# Patient Record
Sex: Female | Born: 1970 | Race: White | Hispanic: No | Smoking: Former smoker
Health system: Southern US, Community
[De-identification: ages and names within clinical notes are randomized; demographics above are authoritative.]

## PROBLEM LIST (undated history)

## (undated) DIAGNOSIS — E039 Hypothyroidism, unspecified: Secondary | ICD-10-CM

## (undated) DIAGNOSIS — R51 Headache: Secondary | ICD-10-CM

## (undated) DIAGNOSIS — R519 Headache, unspecified: Secondary | ICD-10-CM

## (undated) DIAGNOSIS — F419 Anxiety disorder, unspecified: Secondary | ICD-10-CM

## (undated) DIAGNOSIS — E079 Disorder of thyroid, unspecified: Secondary | ICD-10-CM

## (undated) DIAGNOSIS — K219 Gastro-esophageal reflux disease without esophagitis: Secondary | ICD-10-CM

## (undated) HISTORY — PX: DIAGNOSTIC LAPAROSCOPY: SUR761

## (undated) HISTORY — DX: Disorder of thyroid, unspecified: E07.9

---
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
## 1990-04-06 HISTORY — PX: WISDOM TOOTH EXTRACTION: SHX21

## 1998-09-28 ENCOUNTER — Emergency Department (HOSPITAL_COMMUNITY): Admission: EM | Admit: 1998-09-28 | Discharge: 1998-09-28 | Payer: Self-pay | Admitting: Emergency Medicine

## 2000-04-30 ENCOUNTER — Other Ambulatory Visit: Admission: RE | Admit: 2000-04-30 | Discharge: 2000-04-30 | Payer: Self-pay | Admitting: Obstetrics and Gynecology

## 2000-08-02 ENCOUNTER — Other Ambulatory Visit: Admission: RE | Admit: 2000-08-02 | Discharge: 2000-08-02 | Payer: Self-pay | Admitting: Obstetrics and Gynecology

## 2000-09-13 ENCOUNTER — Other Ambulatory Visit: Admission: RE | Admit: 2000-09-13 | Discharge: 2000-09-13 | Payer: Self-pay | Admitting: Obstetrics and Gynecology

## 2000-09-13 ENCOUNTER — Encounter (INDEPENDENT_AMBULATORY_CARE_PROVIDER_SITE_OTHER): Payer: Self-pay | Admitting: Specialist

## 2001-03-23 ENCOUNTER — Other Ambulatory Visit: Admission: RE | Admit: 2001-03-23 | Discharge: 2001-03-23 | Payer: Self-pay | Admitting: Obstetrics and Gynecology

## 2001-06-20 ENCOUNTER — Other Ambulatory Visit: Admission: RE | Admit: 2001-06-20 | Discharge: 2001-06-20 | Payer: Self-pay | Admitting: Obstetrics and Gynecology

## 2001-10-26 ENCOUNTER — Other Ambulatory Visit: Admission: RE | Admit: 2001-10-26 | Discharge: 2001-10-26 | Payer: Self-pay | Admitting: Family Medicine

## 2001-12-16 ENCOUNTER — Encounter: Admission: RE | Admit: 2001-12-16 | Discharge: 2001-12-16 | Payer: Self-pay

## 2002-01-16 ENCOUNTER — Observation Stay (HOSPITAL_COMMUNITY): Admission: AD | Admit: 2002-01-16 | Discharge: 2002-01-17 | Payer: Self-pay

## 2002-06-09 ENCOUNTER — Other Ambulatory Visit: Admission: RE | Admit: 2002-06-09 | Discharge: 2002-06-09 | Payer: Self-pay

## 2002-06-14 ENCOUNTER — Encounter: Admission: RE | Admit: 2002-06-14 | Discharge: 2002-06-14 | Payer: Self-pay

## 2003-01-11 ENCOUNTER — Other Ambulatory Visit: Admission: RE | Admit: 2003-01-11 | Discharge: 2003-01-11 | Payer: Self-pay | Admitting: Family Medicine

## 2003-03-12 ENCOUNTER — Emergency Department (HOSPITAL_COMMUNITY): Admission: AD | Admit: 2003-03-12 | Discharge: 2003-03-12 | Payer: Self-pay | Admitting: Family Medicine

## 2003-03-14 ENCOUNTER — Encounter: Admission: RE | Admit: 2003-03-14 | Discharge: 2003-03-14 | Payer: Self-pay | Admitting: General Surgery

## 2003-03-16 ENCOUNTER — Encounter: Admission: RE | Admit: 2003-03-16 | Discharge: 2003-03-16 | Payer: Self-pay | Admitting: General Surgery

## 2003-07-17 ENCOUNTER — Other Ambulatory Visit: Admission: RE | Admit: 2003-07-17 | Discharge: 2003-07-17 | Payer: Self-pay | Admitting: *Deleted

## 2003-07-17 ENCOUNTER — Other Ambulatory Visit: Admission: RE | Admit: 2003-07-17 | Discharge: 2003-07-17 | Payer: Self-pay | Admitting: Obstetrics and Gynecology

## 2003-12-26 ENCOUNTER — Inpatient Hospital Stay (HOSPITAL_COMMUNITY): Admission: AD | Admit: 2003-12-26 | Discharge: 2003-12-26 | Payer: Self-pay | Admitting: *Deleted

## 2004-02-07 ENCOUNTER — Inpatient Hospital Stay (HOSPITAL_COMMUNITY): Admission: AD | Admit: 2004-02-07 | Discharge: 2004-02-10 | Payer: Self-pay | Admitting: Obstetrics and Gynecology

## 2004-02-11 ENCOUNTER — Encounter: Admission: RE | Admit: 2004-02-11 | Discharge: 2004-03-12 | Payer: Self-pay | Admitting: *Deleted

## 2005-03-03 ENCOUNTER — Emergency Department (HOSPITAL_COMMUNITY): Admission: EM | Admit: 2005-03-03 | Discharge: 2005-03-03 | Payer: Self-pay | Admitting: Family Medicine

## 2006-08-19 ENCOUNTER — Encounter: Admission: RE | Admit: 2006-08-19 | Discharge: 2006-08-19 | Payer: Self-pay | Admitting: Gastroenterology

## 2007-05-23 ENCOUNTER — Encounter: Admission: RE | Admit: 2007-05-23 | Discharge: 2007-05-23 | Payer: Self-pay | Admitting: *Deleted

## 2010-04-27 ENCOUNTER — Encounter: Payer: Self-pay | Admitting: Gastroenterology

## 2010-04-27 ENCOUNTER — Encounter: Payer: Self-pay | Admitting: *Deleted

## 2010-08-22 NOTE — H&P (Signed)
[name], [name]                ACCOUNT NO.:  [id number]   MEDICAL RECORD NO.:  [id number]           PATIENT TYPE:   LOCATION:                                 FACILITY:   PHYSICIAN:  [place] B. Earlene Plater, M.D.       DATE OF BIRTH:   DATE OF ADMISSION:  DATE OF DISCHARGE:                                HISTORY & PHYSICAL   DATE OF ADMISSION:  February 07, 2004   ADMISSION DIAGNOSES:  1.  Term intrauterine pregnancy.  2.  Spontaneous labor.   HISTORY OF PRESENT ILLNESS:  The patient is admitted at 39+ weeks with  regular painful contractions.  Was seen in the office 2 days ago and was 1-2  cm dilated at that time.  She is now found to be 4 cm dilated and having  regular contractions in the office.  No leakage of fluid or vaginal  bleeding.   Prenatal care at [facility], Dr. Earlene Plater, uncomplicated other than  history of previous LEEP with no history of preterm cervical change and new  onset genital condyloma identified during pregnancy.   PAST MEDICAL HISTORY, PAST SURGICAL HISTORY, FAMILY HISTORY:  See prenatal  record.   PRENATAL LABORATORY DATA:  Group B strep is negative.  For others, see  prenatal record.   PHYSICAL EXAMINATION:  VITAL SIGNS:  The patient is afebrile with stable  vital signs.  Fetal heart tones in the 130s.  GENERAL:  Alert and oriented, no acute distress.  SKIN:  Warm and dry, no lesions.  HEART:  Regular rate and rhythm.  LUNGS:  Clear to auscultation.  ABDOMEN:  Gravid, fundal height is appropriate, estimated fetal weight 8  pounds.  VAGINAL:  Cervix is 4 cm dilated, 90% effaced, 0 station, and vertex.   ASSESSMENT:  Term intrauterine pregnancy, spontaneous labor.   PLAN:  Admission for management of labor.     Wesl   WBD/MEDQ  D:  02/07/2004  T:  02/07/2004  Job:  [id number]

## 2010-08-27 ENCOUNTER — Other Ambulatory Visit: Payer: Self-pay | Admitting: *Deleted

## 2010-08-27 MED ORDER — NITROGLYCERIN 0.4 MG SL SUBL: 0.4000 mg | SUBLINGUAL_TABLET | SUBLINGUAL | Status: DC | PRN

## 2010-11-28 ENCOUNTER — Other Ambulatory Visit: Payer: Self-pay | Admitting: Obstetrics and Gynecology

## 2010-11-28 DIAGNOSIS — Z1231 Encounter for screening mammogram for malignant neoplasm of breast: Secondary | ICD-10-CM

## 2011-01-05 ENCOUNTER — Ambulatory Visit
Admission: RE | Admit: 2011-01-05 | Discharge: 2011-01-05 | Disposition: A | Discharge status: Home / Self Care | Payer: Private Health Insurance - Indemnity | Source: Ambulatory Visit | Attending: Obstetrics and Gynecology | Admitting: Obstetrics and Gynecology

## 2011-01-05 DIAGNOSIS — Z1231 Encounter for screening mammogram for malignant neoplasm of breast: Secondary | ICD-10-CM

## 2011-04-23 ENCOUNTER — Ambulatory Visit
Admission: RE | Admit: 2011-04-23 | Discharge: 2011-04-23 | Disposition: A | Discharge status: Home / Self Care | Payer: 59 | Source: Ambulatory Visit | Attending: Gastroenterology | Admitting: Gastroenterology

## 2011-04-23 ENCOUNTER — Other Ambulatory Visit: Payer: Self-pay | Admitting: Gastroenterology

## 2011-04-23 DIAGNOSIS — R509 Fever, unspecified: Secondary | ICD-10-CM

## 2011-04-23 MED ORDER — IOHEXOL 300 MG/ML  SOLN
100.0000 mL | Freq: Once | INTRAMUSCULAR | Status: AC | PRN
  Administered 2011-04-23: 100 mL via INTRAVENOUS

## 2011-12-08 ENCOUNTER — Other Ambulatory Visit: Payer: Self-pay | Admitting: Obstetrics and Gynecology

## 2011-12-08 DIAGNOSIS — Z1231 Encounter for screening mammogram for malignant neoplasm of breast: Secondary | ICD-10-CM

## 2012-01-06 ENCOUNTER — Ambulatory Visit
Admission: RE | Admit: 2012-01-06 | Discharge: 2012-01-06 | Disposition: A | Discharge status: Home / Self Care | Payer: Managed Care, Other (non HMO) | Source: Ambulatory Visit | Attending: Obstetrics and Gynecology | Admitting: Obstetrics and Gynecology

## 2012-01-06 DIAGNOSIS — Z1231 Encounter for screening mammogram for malignant neoplasm of breast: Secondary | ICD-10-CM

## 2012-01-07 ENCOUNTER — Other Ambulatory Visit: Payer: Self-pay | Admitting: Obstetrics and Gynecology

## 2012-01-07 DIAGNOSIS — N63 Unspecified lump in unspecified breast: Secondary | ICD-10-CM

## 2012-01-13 ENCOUNTER — Ambulatory Visit
Admission: RE | Admit: 2012-01-13 | Discharge: 2012-01-13 | Disposition: A | Discharge status: Home / Self Care | Payer: Private Health Insurance - Indemnity | Source: Ambulatory Visit | Attending: Obstetrics and Gynecology | Admitting: Obstetrics and Gynecology

## 2012-01-13 DIAGNOSIS — N63 Unspecified lump in unspecified breast: Secondary | ICD-10-CM

## 2012-09-15 ENCOUNTER — Other Ambulatory Visit: Payer: Self-pay | Admitting: Family Medicine

## 2012-09-15 DIAGNOSIS — E039 Hypothyroidism, unspecified: Secondary | ICD-10-CM

## 2012-09-23 ENCOUNTER — Ambulatory Visit
Admission: RE | Admit: 2012-09-23 | Discharge: 2012-09-23 | Disposition: A | Discharge status: Home / Self Care | Payer: Private Health Insurance - Indemnity | Source: Ambulatory Visit | Attending: Family Medicine | Admitting: Family Medicine

## 2012-09-23 DIAGNOSIS — E039 Hypothyroidism, unspecified: Secondary | ICD-10-CM

## 2013-01-02 ENCOUNTER — Other Ambulatory Visit: Payer: Self-pay | Admitting: Endocrinology

## 2013-01-02 DIAGNOSIS — E039 Hypothyroidism, unspecified: Secondary | ICD-10-CM

## 2013-01-23 ENCOUNTER — Other Ambulatory Visit: Payer: Self-pay

## 2013-01-23 DIAGNOSIS — Z1231 Encounter for screening mammogram for malignant neoplasm of breast: Secondary | ICD-10-CM

## 2013-02-16 ENCOUNTER — Ambulatory Visit
Admission: RE | Admit: 2013-02-16 | Discharge: 2013-02-16 | Disposition: A | Discharge status: Home / Self Care | Payer: BC Managed Care – PPO | Source: Ambulatory Visit

## 2013-02-16 DIAGNOSIS — Z1231 Encounter for screening mammogram for malignant neoplasm of breast: Secondary | ICD-10-CM

## 2013-12-18 ENCOUNTER — Other Ambulatory Visit: Payer: Private Health Insurance - Indemnity

## 2014-07-28 ENCOUNTER — Ambulatory Visit (INDEPENDENT_AMBULATORY_CARE_PROVIDER_SITE_OTHER): Payer: BLUE CROSS/BLUE SHIELD

## 2014-07-28 ENCOUNTER — Ambulatory Visit (INDEPENDENT_AMBULATORY_CARE_PROVIDER_SITE_OTHER): Payer: BLUE CROSS/BLUE SHIELD | Admitting: Family Medicine

## 2014-07-28 VITALS — BP 106/78 | HR 95 | Temp 98.2°F | Resp 18 | Ht 64.5 in | Wt 172.0 lb

## 2014-07-28 DIAGNOSIS — R0789 Other chest pain: Secondary | ICD-10-CM

## 2014-07-28 DIAGNOSIS — M542 Cervicalgia: Secondary | ICD-10-CM

## 2014-07-28 DIAGNOSIS — Z8639 Personal history of other endocrine, nutritional and metabolic disease: Secondary | ICD-10-CM | POA: Insufficient documentation

## 2014-07-28 DIAGNOSIS — F988 Other specified behavioral and emotional disorders with onset usually occurring in childhood and adolescence: Secondary | ICD-10-CM | POA: Insufficient documentation

## 2014-07-28 DIAGNOSIS — F909 Attention-deficit hyperactivity disorder, unspecified type: Secondary | ICD-10-CM | POA: Diagnosis not present

## 2014-07-28 NOTE — Progress Notes (Signed)
Chief Complaint  Patient presents with  . Neck Pain    front left side, some jaw pain, x 2 weeks  . Shortness of Breath   No Known Allergies    Prior to Admission medications   Medication Sig Start Date End Date Taking? Authorizing Provider  amphetamine-dextroamphetamine (ADDERALL) 30 MG tablet Take 30 mg by mouth daily.   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 She's had 2-3 weeks of progressive left neck pain which feels a tightness and radiates into her chest. It's nonexertional. She's had no sore throat.  Patient's past medical history is significant for thyroid nodule and history of hypothyroidism. Nevertheless this is been checked repeatedly and she does not have hypothyroidism now. She's also had an ultrasound and that has not shown anything but some thyroid nodules.  This patient has had no nausea, shortness of breath, vomiting, shoulder pain or abdominal pain.  Ex-smoker  Objective:   BP 106/78 mmHg  Pulse 95  Temp(Src) 98.2 F (36.8 C)  Resp 18  Ht 5' 4.5" (1.638 m)  Wt 172 lb (78.019 kg)  BMI 29.08 kg/m2  SpO2 77%   Sit 44 year old woman appearing stated age. She is in no acute distress. She is very articulate, alert and cooperative HEENT: Unremarkable. Thyroid reveals some fine nodularity and slight increase in size. There Is no adenopathy. TMs are normal. Oropharynx is clear.  Neck: Supple with full range of motion Chest: Clear Heart: Regular no murmur Extremities:  No edema  EKG:  NSR  [facility] reading (PRIMARY) by  Dr. Joseph Art:  Please comment onj opacity on lateral view of chest which I believe is normal (overlapping shadows) and please comment on C7 on AP view of neck which I also think is normal but is a little blurry.  Assessment: I think the patient has GERD. She's had this in the past and her symptoms would certainly fit. At the same time I don't want to miss a cardiac  etiology.  Plan: Start baby aspirin daily and omeprazole 20 mg over-the-counter daily. I will order cardiology consult but cancel this if the chest pain goes away over the next 48 hours.  I explained the patient that GERD can cause everything that she is experiencing. I will have the radiologist over read her films but I don't want patient to worry too much because this is such an atypical kind of pain that is most consistent with GERD. Neck pain on left side - Plan: EKG 12-Lead, EKG 12-Lead, DG Cervical Spine 2 or 3 views  ADD (attention deficit disorder)  Neck pain - Plan: DG Chest 2 View, DG Chest 2 View  Other chest pain    Signed, Carola Frost.D.

## 2014-07-31 ENCOUNTER — Telehealth: Payer: Self-pay | Admitting: Family Medicine

## 2014-07-31 NOTE — Telephone Encounter (Signed)
Patient is calling to get results of her chest x-ray  [phone]

## 2014-07-31 NOTE — Telephone Encounter (Signed)
Pt inquiring about xray results. They haven't been reviewed yet.

## 2014-07-31 NOTE — Telephone Encounter (Signed)
Reported normal x-ray results. Patient verbalized understanding.

## 2015-01-02 ENCOUNTER — Other Ambulatory Visit: Payer: Self-pay | Admitting: Obstetrics and Gynecology

## 2015-01-02 DIAGNOSIS — N6489 Other specified disorders of breast: Secondary | ICD-10-CM

## 2015-01-08 ENCOUNTER — Ambulatory Visit
Admission: RE | Admit: 2015-01-08 | Discharge: 2015-01-08 | Disposition: A | Discharge status: Home / Self Care | Payer: PRIVATE HEALTH INSURANCE | Source: Ambulatory Visit | Attending: Obstetrics and Gynecology | Admitting: Obstetrics and Gynecology

## 2015-01-08 DIAGNOSIS — N6489 Other specified disorders of breast: Secondary | ICD-10-CM

## 2015-01-09 ENCOUNTER — Other Ambulatory Visit: Payer: Self-pay

## 2015-10-11 ENCOUNTER — Emergency Department (HOSPITAL_BASED_OUTPATIENT_CLINIC_OR_DEPARTMENT_OTHER): Payer: BLUE CROSS/BLUE SHIELD

## 2015-10-11 ENCOUNTER — Emergency Department (HOSPITAL_BASED_OUTPATIENT_CLINIC_OR_DEPARTMENT_OTHER)
Admission: EM | Admit: 2015-10-11 | Discharge: 2015-10-11 | Disposition: A | Discharge status: Home / Self Care | Payer: BLUE CROSS/BLUE SHIELD | Attending: Emergency Medicine | Admitting: Emergency Medicine

## 2015-10-11 ENCOUNTER — Encounter (HOSPITAL_BASED_OUTPATIENT_CLINIC_OR_DEPARTMENT_OTHER): Payer: Self-pay | Admitting: Emergency Medicine

## 2015-10-11 DIAGNOSIS — Y999 Unspecified external cause status: Secondary | ICD-10-CM | POA: Diagnosis not present

## 2015-10-11 DIAGNOSIS — Y929 Unspecified place or not applicable: Secondary | ICD-10-CM | POA: Insufficient documentation

## 2015-10-11 DIAGNOSIS — S93602A Unspecified sprain of left foot, initial encounter: Secondary | ICD-10-CM | POA: Insufficient documentation

## 2015-10-11 DIAGNOSIS — F17200 Nicotine dependence, unspecified, uncomplicated: Secondary | ICD-10-CM | POA: Insufficient documentation

## 2015-10-11 DIAGNOSIS — L089 Local infection of the skin and subcutaneous tissue, unspecified: Secondary | ICD-10-CM

## 2015-10-11 DIAGNOSIS — Y939 Activity, unspecified: Secondary | ICD-10-CM | POA: Diagnosis not present

## 2015-10-11 DIAGNOSIS — S80212A Abrasion, left knee, initial encounter: Secondary | ICD-10-CM | POA: Insufficient documentation

## 2015-10-11 DIAGNOSIS — S99922A Unspecified injury of left foot, initial encounter: Secondary | ICD-10-CM | POA: Diagnosis present

## 2015-10-11 MED ORDER — FLUCONAZOLE 150 MG PO TABS: ORAL_TABLET | ORAL | Status: DC

## 2015-10-11 MED ORDER — CEPHALEXIN 250 MG PO CAPS: 250.0000 mg | ORAL_CAPSULE | Freq: Four times a day (QID) | ORAL | Status: DC

## 2015-10-11 MED ORDER — CEPHALEXIN 250 MG PO CAPS
1000.0000 mg | ORAL_CAPSULE | Freq: Once | ORAL | Status: AC
  Administered 2015-10-11: 1000 mg via ORAL
  Filled 2015-10-11: qty 4

## 2015-10-11 NOTE — ED Notes (Signed)
Pt c/o left leg pain after falling off of a golf cart x 5 days ago. Pt has abrasion to left knee, swelling and bruising to left ankle. Pt ambulatory without difficulty.

## 2015-10-11 NOTE — ED Provider Notes (Signed)
CSN: [id number]     Arrival date & time 10/11/15  0100 History   First MD Initiated Contact with Patient 10/11/15 [phone]     Chief Complaint  Patient presents with  . Leg Injury     (Consider location/radiation/quality/duration/timing/severity/associated sxs/prior Treatment) HPI  This is a 45 year old female who fell off the back of a golf cart 5 days ago. She has an abrasion to her left knee and abrasions and ecchymoses to her left ankle and foot. She has developed erythema, pain and tenderness of the anterior left lower leg over the past several days. Pain is moderate and worse with palpation or movement. She is able to bear weight and ambulate. There is no associated deformity.   Past Medical History  Diagnosis Date  . Thyroid disease    History reviewed. No pertinent past surgical history. Family History  Problem Relation Age of Onset  . Hypertension Mother   . Hyperlipidemia Mother   . Hypertension Father    Social History  Substance Use Topics  . Smoking status: Current Every Day Smoker  . Smokeless tobacco: None  . Alcohol Use: 0.0 oz/week    0 Standard drinks or equivalent per week   OB History    No data available     Review of Systems  All other systems reviewed and are negative.   Allergies  Review of patient's allergies indicates no known allergies.  Home Medications   Prior to Admission medications   Medication Sig Start Date End Date Taking? Authorizing Provider  amphetamine-dextroamphetamine (ADDERALL) 30 MG tablet Take 30 mg by mouth daily.    Historical Provider, MD   BP 125/90 mmHg  Pulse 95  Temp(Src) 97.7 F (36.5 C) (Oral)  Resp 18  Ht 5\' 4"  (1.626 m)  Wt 152 lb (68.947 kg)  BMI 26.08 kg/m2  SpO2 100%   Physical Exam  General: Well-developed, well-nourished female in no acute distress; appearance consistent with age of record HENT: normocephalic; atraumatic Eyes: pupils equal, round and reactive to light; extraocular muscles intact Neck:  supple Heart: regular rate and rhythm Lungs: clear to auscultation bilaterally Abdomen: soft; nondistended; nontender; bowel sounds present Extremities: No deformity; full range of motion; pulses normal; ecchymosis of left ankle and foot with tenderness of the dorsal left foot; abrasion of left knee with erythema and tenderness distally:   Neurologic: Awake, alert and oriented; motor function intact in all extremities and symmetric; no facial droop Skin: Warm and dry Psychiatric: Normal mood and affect    ED Course  Procedures (including critical care time)   MDM  Nursing notes and vitals signs, including pulse oximetry, reviewed.  Summary of this visit's results, reviewed by myself:  Imaging Studies: Dg Foot Complete Left  10/11/2015  CLINICAL DATA:  Right foot pain after fall off golf cart 5 days prior. EXAM: LEFT FOOT - COMPLETE 3+ VIEW COMPARISON:  None. FINDINGS: There is no evidence of fracture or dislocation. There is no evidence of arthropathy or other focal bone abnormality. Tiny plantar calcaneal spur. Soft tissues are unremarkable. IMPRESSION: Negative for acute bony abnormality. Electronically Signed   By: Jeb Levering M.D.   On: 10/11/2015 03:02        Shanon Rosser, MD 10/11/15 [phone]

## 2016-02-29 ENCOUNTER — Inpatient Hospital Stay (HOSPITAL_COMMUNITY)
Admission: EM | Admit: 2016-02-29 | Discharge: 2016-03-02 | DRG: 872 | Disposition: A | Discharge status: Home / Self Care | Payer: BLUE CROSS/BLUE SHIELD | Attending: Internal Medicine | Admitting: Internal Medicine

## 2016-02-29 ENCOUNTER — Emergency Department (HOSPITAL_COMMUNITY): Payer: BLUE CROSS/BLUE SHIELD

## 2016-02-29 ENCOUNTER — Encounter (HOSPITAL_COMMUNITY): Payer: Self-pay

## 2016-02-29 DIAGNOSIS — R1013 Epigastric pain: Secondary | ICD-10-CM

## 2016-02-29 DIAGNOSIS — E872 Acidosis: Secondary | ICD-10-CM | POA: Diagnosis present

## 2016-02-29 DIAGNOSIS — K529 Noninfective gastroenteritis and colitis, unspecified: Secondary | ICD-10-CM | POA: Diagnosis present

## 2016-02-29 DIAGNOSIS — E860 Dehydration: Secondary | ICD-10-CM

## 2016-02-29 DIAGNOSIS — E039 Hypothyroidism, unspecified: Secondary | ICD-10-CM | POA: Diagnosis present

## 2016-02-29 DIAGNOSIS — E042 Nontoxic multinodular goiter: Secondary | ICD-10-CM | POA: Diagnosis present

## 2016-02-29 DIAGNOSIS — K219 Gastro-esophageal reflux disease without esophagitis: Secondary | ICD-10-CM | POA: Diagnosis present

## 2016-02-29 DIAGNOSIS — Z79899 Other long term (current) drug therapy: Secondary | ICD-10-CM

## 2016-02-29 DIAGNOSIS — F17200 Nicotine dependence, unspecified, uncomplicated: Secondary | ICD-10-CM | POA: Diagnosis present

## 2016-02-29 DIAGNOSIS — D649 Anemia, unspecified: Secondary | ICD-10-CM | POA: Diagnosis present

## 2016-02-29 DIAGNOSIS — R6520 Severe sepsis without septic shock: Secondary | ICD-10-CM

## 2016-02-29 DIAGNOSIS — A419 Sepsis, unspecified organism: Principal | ICD-10-CM | POA: Diagnosis present

## 2016-02-29 DIAGNOSIS — Z8249 Family history of ischemic heart disease and other diseases of the circulatory system: Secondary | ICD-10-CM

## 2016-02-29 DIAGNOSIS — E876 Hypokalemia: Secondary | ICD-10-CM | POA: Diagnosis present

## 2016-02-29 LAB — URINALYSIS, ROUTINE W REFLEX MICROSCOPIC
Bilirubin Urine: NEGATIVE
Glucose, UA: NEGATIVE mg/dL
Hgb urine dipstick: NEGATIVE
Ketones, ur: NEGATIVE mg/dL
Leukocytes, UA: NEGATIVE
Nitrite: NEGATIVE
Protein, ur: NEGATIVE mg/dL
Specific Gravity, Urine: 1.022 (ref 1.005–1.030)
pH: 5.5 (ref 5.0–8.0)

## 2016-02-29 LAB — COMPREHENSIVE METABOLIC PANEL
ALT: 18 U/L (ref 14–54)
AST: 26 U/L (ref 15–41)
Albumin: 3.9 g/dL (ref 3.5–5.0)
Alkaline Phosphatase: 51 U/L (ref 38–126)
Anion gap: 10 (ref 5–15)
BUN: 9 mg/dL (ref 6–20)
CO2: 19 mmol/L — ABNORMAL LOW (ref 22–32)
Calcium: 8.9 mg/dL (ref 8.9–10.3)
Chloride: 110 mmol/L (ref 101–111)
Creatinine, Ser: 0.79 mg/dL (ref 0.44–1.00)
GFR calc Af Amer: >60 mL/min (ref >60)
GFR calc non Af Amer: >60 mL/min (ref >60)
Glucose, Bld: 108 mg/dL — ABNORMAL HIGH (ref 65–99)
Potassium: 3.7 mmol/L (ref 3.5–5.1)
Sodium: 139 mmol/L (ref 135–145)
Total Bilirubin: 0.5 mg/dL (ref 0.3–1.2)
Total Protein: 6.5 g/dL (ref 6.5–8.1)

## 2016-02-29 LAB — CBC
HCT: 43.1 % (ref 36.0–46.0)
HEMOGLOBIN: 15.2 g/dL — AB (ref 12.0–15.0)
MCH: 32.6 pg (ref 26.0–34.0)
MCHC: 35.3 g/dL (ref 30.0–36.0)
MCV: 92.5 fL (ref 78.0–100.0)
Platelets: 308 10*3/uL (ref 150–400)
RBC: 4.66 MIL/uL (ref 3.87–5.11)
RDW: 12.6 % (ref 11.5–15.5)
WBC: 18.6 10*3/uL — AB (ref 4.0–10.5)

## 2016-02-29 LAB — I-STAT BETA HCG BLOOD, ED (MC, WL, AP ONLY): I-stat hCG, quantitative: <5 m[IU]/mL (ref <5)

## 2016-02-29 LAB — I-STAT CG4 LACTIC ACID, ED: Lactic Acid, Venous: 2.82 mmol/L (ref 0.5–1.9)

## 2016-02-29 LAB — LIPASE, BLOOD: Lipase: 42 U/L (ref 11–51)

## 2016-02-29 MED ORDER — SODIUM CHLORIDE 0.9 % IV BOLUS (SEPSIS)
1000.0000 mL | Freq: Once | INTRAVENOUS | Status: AC
Start: 2016-02-29 — End: 2016-03-01
  Administered 2016-02-29: 1000 mL via INTRAVENOUS

## 2016-02-29 MED ORDER — SODIUM CHLORIDE 0.9 % IV SOLN
3.0000 g | Freq: Once | INTRAVENOUS | Status: AC
  Administered 2016-02-29: 3 g via INTRAVENOUS
  Filled 2016-02-29: qty 3

## 2016-02-29 MED ORDER — ONDANSETRON 4 MG PO TBDP
4.0000 mg | ORAL_TABLET | Freq: Once | ORAL | Status: AC
  Administered 2016-02-29: 4 mg via ORAL

## 2016-02-29 MED ORDER — IOPAMIDOL (ISOVUE-300) INJECTION 61%
INTRAVENOUS | Status: AC
  Administered 2016-02-29: 100 mL
  Filled 2016-02-29: qty 100

## 2016-02-29 MED ORDER — FAMOTIDINE IN NACL 20-0.9 MG/50ML-% IV SOLN
20.0000 mg | Freq: Once | INTRAVENOUS | Status: AC
  Administered 2016-02-29: 20 mg via INTRAVENOUS
  Filled 2016-02-29: qty 50

## 2016-02-29 MED ORDER — ACETAMINOPHEN 500 MG PO TABS
1000.0000 mg | ORAL_TABLET | Freq: Once | ORAL | Status: AC
Start: 2016-02-29 — End: 2016-02-29
  Administered 2016-02-29: 1000 mg via ORAL
  Filled 2016-02-29: qty 2

## 2016-02-29 MED ORDER — ONDANSETRON 4 MG PO TBDP
ORAL_TABLET | ORAL | Status: AC
  Filled 2016-02-29: qty 1

## 2016-02-29 MED ORDER — FENTANYL CITRATE (PF) 100 MCG/2ML IJ SOLN
50.0000 ug | Freq: Once | INTRAMUSCULAR | Status: DC
  Filled 2016-02-29: qty 2

## 2016-02-29 MED ORDER — SODIUM CHLORIDE 0.9 % IV BOLUS (SEPSIS)
1000.0000 mL | Freq: Once | INTRAVENOUS | Status: AC
  Administered 2016-02-29: 1000 mL via INTRAVENOUS

## 2016-02-29 MED ORDER — ONDANSETRON HCL 4 MG/2ML IJ SOLN
4.0000 mg | Freq: Once | INTRAMUSCULAR | Status: AC
  Administered 2016-02-29: 4 mg via INTRAVENOUS
  Filled 2016-02-29: qty 2

## 2016-02-29 MED ORDER — CIPROFLOXACIN HCL 500 MG PO TABS
500.0000 mg | ORAL_TABLET | Freq: Once | ORAL | Status: AC
  Administered 2016-02-29: 500 mg via ORAL
  Filled 2016-02-29: qty 1

## 2016-02-29 NOTE — ED Triage Notes (Signed)
Pt presents with 2 day h/o bilateral upper abdominal pain.  +nausea, vomiting and diarrhea

## 2016-02-29 NOTE — ED Provider Notes (Signed)
[facility] DEPT Provider Note   CSN: [id number] Arrival date & time: 02/29/16  1408     History   Chief Complaint Chief Complaint  Patient presents with  . Abdominal Pain    HPI [name] is a 45 y.o. female.  The history is provided by the patient.  Abdominal Pain   This is a new problem. The current episode started yesterday. The problem occurs constantly (fluctuating). The problem has been gradually worsening. The pain is associated with eating. The pain is located in the epigastric region. The pain is severe. Associated symptoms include diarrhea (for 3 days), nausea and vomiting. Pertinent negatives include fever, melena, constipation and dysuria. Nothing aggravates the symptoms. Nothing relieves the symptoms. Her past medical history is significant for GERD.    Past Medical History:  Diagnosis Date  . Thyroid disease     Patient Active Problem List   Diagnosis Date Noted  . Enteritis 02/29/2016  . Sepsis ([place]) 02/29/2016  . ADD (attention deficit disorder) 07/28/2014  . H/O thyroid nodule 07/28/2014    History reviewed. No pertinent surgical history.  OB History    No data available       Home Medications    Prior to Admission medications   Medication Sig Start Date End Date Taking? Authorizing Provider  acetaminophen (TYLENOL) 325 MG tablet Take 325-650 mg by mouth every 6 (six) hours as needed for headache.   Yes Historical Provider, MD  omeprazole (PRILOSEC OTC) 20 MG tablet Take 20 mg by mouth daily as needed (for heartburn).   Yes Historical Provider, MD    Family History Family History  Problem Relation Age of Onset  . Hypertension Mother   . Hyperlipidemia Mother   . Hypertension Father     Social History Social History  Substance Use Topics  . Smoking status: Current Every Day Smoker  . Smokeless tobacco: Never Used  . Alcohol use 0.0 oz/week     Comment: social     Allergies   Patient has no known allergies.   Review of  Systems Review of Systems  Constitutional: Negative for fever.  Gastrointestinal: Positive for abdominal pain, diarrhea (for 3 days), nausea and vomiting. Negative for constipation and melena.  Genitourinary: Negative for dysuria.  Ten systems are reviewed and are negative for acute change except as noted in the HPI    Physical Exam Updated Vital Signs BP 96/58   Pulse 115   Temp 98.1 F (36.7 C) (Oral)   Resp (!) 8   Wt 166 lb 14.4 oz (75.7 kg)   LMP 02/09/2016 (Approximate)   SpO2 97%   BMI 28.65 kg/m   Physical Exam  Constitutional: She is oriented to person, place, and time. She appears well-developed and well-nourished. No distress.  HENT:  Head: Normocephalic and atraumatic.  Nose: Nose normal.  Eyes: Conjunctivae and EOM are normal. Pupils are equal, round, and reactive to light. Right eye exhibits no discharge. Left eye exhibits no discharge. No scleral icterus.  Neck: Normal range of motion. Neck supple.  Cardiovascular: Normal rate and regular rhythm.  Exam reveals no gallop and no friction rub.   No murmur heard. Pulmonary/Chest: Effort normal and breath sounds normal. No stridor. No respiratory distress. She has no rales.  Abdominal: Soft. She exhibits no distension. There is tenderness in the right upper quadrant, epigastric area and left upper quadrant. There is no rigidity, no rebound, no guarding and no CVA tenderness.  Musculoskeletal: She exhibits no edema or tenderness.  Neurological: She is alert and oriented to person, place, and time.  Skin: Skin is warm and dry. No rash noted. She is not diaphoretic. No erythema.  Psychiatric: She has a normal mood and affect.  Vitals reviewed.    ED Treatments / Results  Labs (all labs ordered are listed, but only abnormal results are displayed) Labs Reviewed  COMPREHENSIVE METABOLIC PANEL - Abnormal; Notable for the following:       Result Value   CO2 19 (*)    Glucose, Bld 108 (*)    All other components  within normal limits  CBC - Abnormal; Notable for the following:    WBC 18.6 (*)    Hemoglobin 15.2 (*)    All other components within normal limits  I-STAT CG4 LACTIC ACID, ED - Abnormal; Notable for the following:    Lactic Acid, Venous 2.82 (*)    All other components within normal limits  C DIFFICILE QUICK SCREEN W PCR REFLEX  GASTROINTESTINAL PANEL BY PCR, STOOL (REPLACES STOOL CULTURE)  LIPASE, BLOOD  URINALYSIS, ROUTINE W REFLEX MICROSCOPIC (NOT AT [facility])  I-STAT BETA HCG BLOOD, ED ([facility], [facility], [facility] ONLY)    EKG  EKG Interpretation None       Radiology Ct Abdomen Pelvis W Contrast  Result Date: 02/29/2016 CLINICAL DATA:  Abdominal pain, mid abdominal pain, right lower quadrant pain EXAM: CT ABDOMEN AND PELVIS WITH CONTRAST TECHNIQUE: Multidetector CT imaging of the abdomen and pelvis was performed using the standard protocol following bolus administration of intravenous contrast. CONTRAST:  153mL ISOVUE-300 IOPAMIDOL (ISOVUE-300) INJECTION 61% COMPARISON:  04/23/2011 FINDINGS: Lower chest: No acute findings are noted lung bases. There is small hiatal hernia. Hepatobiliary: Enhanced liver shows no biliary ductal dilatation. No focal hepatic mass. No calcified gallstones are noted within gallbladder. Pancreas: Enhanced pancreas is normal. Spleen: Enhanced spleen is normal. Adrenals/Urinary Tract: No adrenal gland mass. Enhanced kidneys are symmetrical in size. No hydronephrosis or hydroureter. There is nonobstructive calcified calculus midpole of the left kidney measures 2 mm. A cyst is noted in midpole of the left kidney laterally measures 9 mm. Delayed renal images shows bilateral renal symmetrical excretion. Bilateral visualized proximal ureter is unremarkable. Stomach/Bowel: There is no small bowel obstruction. No pericecal inflammation is noted. Normal appendix is noted in axial image 62. Axial images 67 through 72 there is segmental thickening of small bowel wall in mid lower abdomen  and pelvis. This is confirmed on coronal image 49 findings are highly suspicious for distal enteritis. There is no evidence of mesenteric abscess. No thickening of the wall of terminal ileum. No distal colonic obstruction. No evidence of colitis or diverticulitis. Vascular/Lymphatic: No aortic aneurysm.  No adenopathy. Reproductive: The uterus is anteflexed. No adnexal mass. A cyst/ follicle within left ovary measures 2.3 cm. There is a small fibroid in inferior aspect of the right uterine body measures 1.4 cm please see coronal image 48. Other: No free abdominal air. Limited assessment of the urinary bladder which is under distended. Musculoskeletal: No destructive bony lesions are noted. Sagittal images of the spine shows mild degenerative changes lower thoracic spine. IMPRESSION: 1. There is abnormal mild thickening and enhancement of small bowel wall in distal bowel loops in mid lower abdomen/pelvis. Please see axial images 67 to 72 and coronal image 49. Findings are consistent with distal enteritis. Inflammatory bowel disease cannot be excluded. 2. No pericecal inflammation.  Normal appendix. 3. No hydronephrosis or hydroureter. There is left nonobstructive nephrolithiasis. 4. Small hiatal hernia. 5. Anteflexed uterus.  Question small fibroid within right inferior aspect of uterine fundus measures about 1.4 cm. There is a left ovarian cyst/follicle measures 2.3 cm. 6. No ascites or free abdominal air. Electronically Signed   By: Lahoma Crocker M.D.   On: 02/29/2016 19:59   US Abdomen Limited Ruq  Result Date: 02/29/2016 CLINICAL DATA:  Epigastric pain. EXAM: US ABDOMEN LIMITED - RIGHT UPPER QUADRANT COMPARISON:  CT abdomen and pelvis February 29, 2016 at 1935 hours FINDINGS: Gallbladder: No gallstones or significant wall thickening visualized. Echogenic non shadowing 6 mm mural based polyp. No indicated follow-up. No sonographic Murphy sign noted by sonographer. Common bile duct: Diameter: 4 mm. Liver: No focal  lesion identified. Within normal limits in parenchymal echogenicity. Hepatopetal portal vein. IMPRESSION: No acute RIGHT upper quadrant process. Electronically Signed   By: Elon Alas M.D.   On: 02/29/2016 20:39    Procedures Procedures (including critical care time)  Medications Ordered in ED Medications  fentaNYL (SUBLIMAZE) injection 50 mcg (50 mcg Intravenous Not Given 02/29/16 1905)  ondansetron (ZOFRAN-ODT) disintegrating tablet 4 mg (4 mg Oral Given 02/29/16 1427)  sodium chloride 0.9 % bolus 1,000 mL (0 mLs Intravenous Stopped 02/29/16 2005)  famotidine (PEPCID) IVPB 20 mg premix (0 mg Intravenous Stopped 02/29/16 1934)  ondansetron (ZOFRAN) injection 4 mg (4 mg Intravenous Given 02/29/16 1900)  iopamidol (ISOVUE-300) 61 % injection (100 mLs  Contrast Given 02/29/16 1915)  ciprofloxacin (CIPRO) tablet 500 mg (500 mg Oral Given 02/29/16 2127)  sodium chloride 0.9 % bolus 1,000 mL (0 mLs Intravenous Stopped 02/29/16 2230)  acetaminophen (TYLENOL) tablet 1,000 mg (1,000 mg Oral Given 02/29/16 2225)  sodium chloride 0.9 % bolus 1,000 mL (1,000 mLs Intravenous New Bag/Given 02/29/16 2319)  Ampicillin-Sulbactam (UNASYN) 3 g in sodium chloride 0.9 % 100 mL IVPB (0 g Intravenous Stopped 02/29/16 2348)     Initial Impression / Assessment and Plan / ED Course  I have reviewed the triage vital signs and the nursing notes.  Pertinent labs & imaging results that were available during my care of the patient were reviewed by me and considered in my medical decision making (see chart for details).  Clinical Course     Workup revealed enteritis. Patient provided with nausea, pain medicine, IV fluids, nothing by mouth Cipro. During stay patient's blood pressure was noted to be trending down. Patient reported that at baseline she has low blood pressures however upon review of records she was noted to have normal blood pressures in the 120s last year. Given the patient's nausea, vomiting,  diarrhea, this likely secondary to dehydration however there is a concern for possible sepsis.  Patient adamantly stated that she did not want to be admitted. I was able to convince the patient to stay for additional IV fluids to see if her blood pressure improved.  Patient's blood pressure did improve with IV fluid administration however once the IV fluid had completed, her blood pressure trended down again. At this point, I had a sincere in discussion with the patient regarding the risks and my concerns for possible sepsis. She agreed to continue the workup and be admitted.  Code sepsis was initiated and patient was given IV antibiotics. Also given a third liter of IV fluids. Lactic acid was obtained which was elevated.  Appreciate hospitalist admission  Final Clinical Impressions(s) / ED Diagnoses   Final diagnoses:  Epigastric pain  Enteritis  Severe sepsis [facility])      Fatima Blank, MD 03/01/16 [phone]

## 2016-02-29 NOTE — ED Notes (Signed)
Pt was found sitting on the floor in [place] bathroom actively vomiting.  This tech assisted pt to wheelchair and advised that she may need a room.

## 2016-02-29 NOTE — ED Notes (Signed)
Will obtain vitals when pt returns from CT.

## 2016-02-29 NOTE — H&P (Signed)
[name] [id number] DOB: [birth year] DOA: 02/29/2016     PCP: She has been seen at [facility] in the past Outpatient Specialists: OB GYN Patient coming from:    home Lives   With family    Chief Complaint: Diarrhea  HPI: [name] is a 45 y.o. female with medical history significant of hypothyroidism, GERD     Presented with 2 day history of bilateral upper abdominal pain nausea vomiting and diarrhea no sick contacts. Both ate at "cookout" but husband did not get sick.  she also went out to [employer]. Yesterday she ate cereal in AM and developed severe epigastric pain. She is started to have worsening symptoms. Pain is worse with eating and mania epigastric area and severe diarrhea has been going on for 3 days. No fevers or chills no blood in stool no dysuria no chest pain or shortness of breath. She reports after getting IV fluid she is feeling a bit better.    Regarding pertinent Chronic problems: Patient has history of hypothyroidism and has known thyroid nodules   IN ER:  Temp (24hrs), Avg:98.1 F (36.7 C), Min:98.1 F (36.7 C), Max:98.1 F (36.7 C)      Pulse 121 BP 83/48   Lactic acid 2.82 WBC 18.6 hemoglobin 15.2 creatinine 0.79  Ultrasound showing no acute right upper quadrant process  CT of abdomen showing abnormal mild thickening of the small bowel wall consistent with distal enteritis Following Medications were ordered in ER: Medications  fentaNYL (SUBLIMAZE) injection 50 mcg (50 mcg Intravenous Not Given 02/29/16 1905)  ondansetron (ZOFRAN-ODT) disintegrating tablet 4 mg (4 mg Oral Given 02/29/16 1427)  sodium chloride 0.9 % bolus 1,000 mL (0 mLs Intravenous Stopped 02/29/16 2005)  famotidine (PEPCID) IVPB 20 mg premix (0 mg Intravenous Stopped 02/29/16 1934)  ondansetron (ZOFRAN) injection 4 mg (4 mg Intravenous Given 02/29/16 1900)  iopamidol (ISOVUE-300) 61 % injection (100 mLs  Contrast Given 02/29/16 1915)    ciprofloxacin (CIPRO) tablet 500 mg (500 mg Oral Given 02/29/16 2127)  sodium chloride 0.9 % bolus 1,000 mL (0 mLs Intravenous Stopped 02/29/16 2230)  acetaminophen (TYLENOL) tablet 1,000 mg (1,000 mg Oral Given 02/29/16 2225)  sodium chloride 0.9 % bolus 1,000 mL (1,000 mLs Intravenous New Bag/Given 02/29/16 2319)  Ampicillin-Sulbactam (UNASYN) 3 g in sodium chloride 0.9 % 100 mL IVPB (3 g Intravenous New Bag/Given 02/29/16 2318)       Hospitalist was called for admission for Sepsis secondary to enteritis  Review of Systems:    Pertinent positives include: abdominal pain, nausea, vomiting, diarrhea,   Constitutional:  No weight loss, night sweats, Fevers, chills, fatigue, weight loss  HEENT:  No headaches, Difficulty swallowing,Tooth/dental problems,Sore throat,  No sneezing, itching, ear ache, nasal congestion, post nasal drip,  Cardio-vascular:  No chest pain, Orthopnea, PND, anasarca, dizziness, palpitations.no Bilateral lower extremity swelling  GI:  No heartburn, indigestion, change in bowel habits, loss of appetite, melena, blood in stool, hematemesis Resp:  no shortness of breath at rest. No dyspnea on exertion, No excess mucus, no productive cough, No non-productive cough, No coughing up of blood.No change in color of mucus.No wheezing. Skin:  no rash or lesions. No jaundice GU:  no dysuria, change in color of urine, no urgency or frequency. No straining to urinate.  No flank pain.  Musculoskeletal:  No joint pain or no joint swelling. No decreased range of motion. No back pain.  Psych:  No change in mood or affect.  No depression or anxiety. No memory loss.  Neuro: no localizing neurological complaints, no tingling, no weakness, no double vision, no gait abnormality, no slurred speech, no confusion  As per HPI otherwise 10 point review of systems negative.   Past Medical History: Past Medical History:  Diagnosis Date  . Thyroid disease    History reviewed. No  pertinent surgical history.   Social History:  Ambulatory independently     reports that she has been smoking.  She has never used smokeless tobacco. She reports that she drinks alcohol. Her drug history is not on file.  Allergies:  No Known Allergies     Family History:   Family History  Problem Relation Age of Onset  . Hypertension Mother   . Hyperlipidemia Mother   . Hypertension Father     Medications: Prior to Admission medications   Medication Sig Start Date End Date Taking? Authorizing Provider  acetaminophen (TYLENOL) 325 MG tablet Take 325-650 mg by mouth every 6 (six) hours as needed for headache.   Yes Historical Provider, MD  omeprazole (PRILOSEC OTC) 20 MG tablet Take 20 mg by mouth daily as needed (for heartburn).   Yes Historical Provider, MD  cephALEXin (KEFLEX) 250 MG capsule Take 1 capsule (250 mg total) by mouth 4 (four) times daily. Patient not taking: Reported on 02/29/2016 10/11/15   Shanon Rosser, MD  fluconazole (DIFLUCAN) 150 MG tablet Take one tablet as needed for vaginal yeast infection. May repeat in 3 days if necessary. Patient not taking: Reported on 02/29/2016 10/11/15   Shanon Rosser, MD    Physical Exam: Patient Vitals for the past 24 hrs:  BP Temp Temp src Pulse Resp SpO2 Weight  02/29/16 2345 (!) 83/48 - - (!) 121 - 98 % -  02/29/16 2315 (!) 83/47 - - (!) 127 - 98 % -  02/29/16 2300 (!) 83/47 - - (!) 123 - 97 % -  02/29/16 2245 (!) 88/47 - - 120 - 99 % -  02/29/16 2230 95/63 - - 118 - 100 % -  02/29/16 2215 102/66 - - (!) 121 - 100 % -  02/29/16 2115 98/63 - - 117 - 100 % -  02/29/16 2045 91/57 - - 114 - 100 % -  02/29/16 2041 98/60 - - 116 18 100 % -  02/29/16 1715 111/75 - - 102 - 100 % -  02/29/16 1700 107/73 - - 108 - 100 % -  02/29/16 1645 106/66 - - 99 - 100 % -  02/29/16 1416 98/65 98.1 F (36.7 C) Oral 108 16 100 % 75.7 kg (166 lb 14.4 oz)    1. General:  in No Acute distress 2. Psychological: Alert and   Oriented 3. Head/ENT:    Dry Mucous Membranes                          Head Non traumatic, neck supple                           Normal  Dentition 4. SKIN:   decreased Skin turgor,  Skin clean Dry and intact no rash 5. Heart: Regular rate and rhythm no Murmur, Rub or gallop 6. Lungs:  Clear to auscultation bilaterally, no wheezes or crackles   7. Abdomen: Soft, epigastric tenderness, Non distended 8. Lower extremities: no clubbing, cyanosis, or edema 9. Neurologically Grossly intact, moving all 4 extremities equally  10. MSK: Normal  range of motion   body mass index is 28.65 kg/m.  Labs on Admission:   Labs on Admission: I have personally reviewed following labs and imaging studies  CBC:  Recent Labs Lab 02/29/16 1442  WBC 18.6*  HGB 15.2*  HCT 43.1  MCV 92.5  PLT [id number]   Basic Metabolic Panel:  Recent Labs Lab 02/29/16 1442  NA 139  K 3.7  CL 110  CO2 19*  GLUCOSE 108*  BUN 9  CREATININE 0.79  CALCIUM 8.9   GFR: Estimated Creatinine Clearance: 88.5 mL/min (by C-G formula based on SCr of 0.79 mg/dL). Liver Function Tests:  Recent Labs Lab 02/29/16 1442  AST 26  ALT 18  ALKPHOS 51  BILITOT 0.5  PROT 6.5  ALBUMIN 3.9    Recent Labs Lab 02/29/16 1442  LIPASE 42   No results for input(s): AMMONIA in the last 168 hours. Coagulation Profile: No results for input(s): INR, PROTIME in the last 168 hours. Cardiac Enzymes: No results for input(s): CKTOTAL, CKMB, CKMBINDEX, TROPONINI in the last 168 hours. BNP (last 3 results) No results for input(s): PROBNP in the last 8760 hours. HbA1C: No results for input(s): HGBA1C in the last 72 hours. CBG: No results for input(s): GLUCAP in the last 168 hours. Lipid Profile: No results for input(s): CHOL, HDL, LDLCALC, TRIG, CHOLHDL, LDLDIRECT in the last 72 hours. Thyroid Function Tests: No results for input(s): TSH, T4TOTAL, FREET4, T3FREE, THYROIDAB in the last 72 hours. Anemia Panel: No results for input(s): VITAMINB12, FOLATE,  FERRITIN, TIBC, IRON, RETICCTPCT in the last 72 hours. Urine analysis:    Component Value Date/Time   COLORURINE YELLOW 02/29/2016 [place] 02/29/2016 1438   LABSPEC 1.022 02/29/2016 1438   PHURINE 5.5 02/29/2016 1438   GLUCOSEU NEGATIVE 02/29/2016 1438   HGBUR NEGATIVE 02/29/2016 1438   BILIRUBINUR NEGATIVE 02/29/2016 1438   KETONESUR NEGATIVE 02/29/2016 1438   PROTEINUR NEGATIVE 02/29/2016 1438   NITRITE NEGATIVE 02/29/2016 1438   LEUKOCYTESUR NEGATIVE 02/29/2016 1438   Sepsis Labs: @LABRCNTIP (procalcitonin:4,lacticidven:4) )No results found for this or any previous visit (from the past 240 hour(s)).      UA   no evidence of UTI    No results found for: HGBA1C  Estimated Creatinine Clearance: 88.5 mL/min (by C-G formula based on SCr of 0.79 mg/dL).  BNP (last 3 results) No results for input(s): PROBNP in the last 8760 hours.   ECG REPORT  Independently reviewed Rate:122  Rhythm: sinus tachycardia  ST&T Change: No acute ischemic changes   QTC 478  Filed Weights   02/29/16 1416  Weight: 75.7 kg (166 lb 14.4 oz)     Cultures: No results found for: SDES, SPECREQUEST, CULT, REPTSTATUS   Radiological Exams on Admission: Ct Abdomen Pelvis W Contrast  Result Date: 02/29/2016 CLINICAL DATA:  Abdominal pain, mid abdominal pain, right lower quadrant pain EXAM: CT ABDOMEN AND PELVIS WITH CONTRAST TECHNIQUE: Multidetector CT imaging of the abdomen and pelvis was performed using the standard protocol following bolus administration of intravenous contrast. CONTRAST:  131mL ISOVUE-300 IOPAMIDOL (ISOVUE-300) INJECTION 61% COMPARISON:  04/23/2011 FINDINGS: Lower chest: No acute findings are noted lung bases. There is small hiatal hernia. Hepatobiliary: Enhanced liver shows no biliary ductal dilatation. No focal hepatic mass. No calcified gallstones are noted within gallbladder. Pancreas: Enhanced pancreas is normal. Spleen: Enhanced spleen is normal.  Adrenals/Urinary Tract: No adrenal gland mass. Enhanced kidneys are symmetrical in size. No hydronephrosis or hydroureter. There is nonobstructive calcified calculus midpole of the left  kidney measures 2 mm. A cyst is noted in midpole of the left kidney laterally measures 9 mm. Delayed renal images shows bilateral renal symmetrical excretion. Bilateral visualized proximal ureter is unremarkable. Stomach/Bowel: There is no small bowel obstruction. No pericecal inflammation is noted. Normal appendix is noted in axial image 62. Axial images 67 through 72 there is segmental thickening of small bowel wall in mid lower abdomen and pelvis. This is confirmed on coronal image 49 findings are highly suspicious for distal enteritis. There is no evidence of mesenteric abscess. No thickening of the wall of terminal ileum. No distal colonic obstruction. No evidence of colitis or diverticulitis. Vascular/Lymphatic: No aortic aneurysm.  No adenopathy. Reproductive: The uterus is anteflexed. No adnexal mass. A cyst/ follicle within left ovary measures 2.3 cm. There is a small fibroid in inferior aspect of the right uterine body measures 1.4 cm please see coronal image 48. Other: No free abdominal air. Limited assessment of the urinary bladder which is under distended. Musculoskeletal: No destructive bony lesions are noted. Sagittal images of the spine shows mild degenerative changes lower thoracic spine. IMPRESSION: 1. There is abnormal mild thickening and enhancement of small bowel wall in distal bowel loops in mid lower abdomen/pelvis. Please see axial images 67 to 72 and coronal image 49. Findings are consistent with distal enteritis. Inflammatory bowel disease cannot be excluded. 2. No pericecal inflammation.  Normal appendix. 3. No hydronephrosis or hydroureter. There is left nonobstructive nephrolithiasis. 4. Small hiatal hernia. 5. Anteflexed uterus. Question small fibroid within right inferior aspect of uterine fundus  measures about 1.4 cm. There is a left ovarian cyst/follicle measures 2.3 cm. 6. No ascites or free abdominal air. Electronically Signed   By: Lahoma Crocker M.D.   On: 02/29/2016 19:59   US Abdomen Limited Ruq  Result Date: 02/29/2016 CLINICAL DATA:  Epigastric pain. EXAM: US ABDOMEN LIMITED - RIGHT UPPER QUADRANT COMPARISON:  CT abdomen and pelvis February 29, 2016 at 1935 hours FINDINGS: Gallbladder: No gallstones or significant wall thickening visualized. Echogenic non shadowing 6 mm mural based polyp. No indicated follow-up. No sonographic Murphy sign noted by sonographer. Common bile duct: Diameter: 4 mm. Liver: No focal lesion identified. Within normal limits in parenchymal echogenicity. Hepatopetal portal vein. IMPRESSION: No acute RIGHT upper quadrant process. Electronically Signed   By: Elon Alas M.D.   On: 02/29/2016 20:39    Chart has been reviewed    Assessment/Plan  45 y.o. female with medical history significant of hypothyroidism, GERD admitted for sepsis due to enteritis  Present on Admission: . Enteritis - [place] and dehydration versus sepsis. Was treated with fluid resuscitation obtain gastric panel . Sepsis ([place]) - Admit per Sepsis protocol likely source being  intra-abdominal infection  - rehydrate with 7ml/kg  - initiate broad spectrum antibiotics  Zosyn  -  obtain blood cultures  - Obtain serial lactic acid  - Obtain procalcitonin level  - Admit and monitor vital signs closely  -  PCCM has been made aware for e-link monitoring  Sepsis - Repeat Assessment  Performed at:    12:50 am  Vitals     Blood pressure (!) 86/48, pulse 113, temperature 98.1 F (36.7 C), temperature source Oral, resp. rate 19, weight 75.7 kg (166 lb 14.4 oz), last menstrual period 02/09/2016, SpO2 98 %.  Heart:     Tachycardic  Lungs:    CTA  Capillary Refill:   <2 sec  Peripheral Pulse:   Radial pulse palpable  Skin:  Flushed     Other plan as per orders.  DVT  prophylaxis:   Lovenox     Code Status:  FULL CODE as per patient   Family Communication:   Family not  at  Bedside Boyfriend is at bedside    Disposition Plan:     To home once workup is complete and patient is stable              Consults called: PCCM   Admission status:   inpatient       Level of care       SDU      I have spent a total of 56 min on this admission   Juel Ripley 03/01/2016, 1:31 AM    [facility]  Pager [phone]   after 2 AM please page floor coverage PA If 7AM-7PM, please contact the day team taking care of the patient  [email or web address]  Password [email or web address]

## 2016-02-29 NOTE — ED Notes (Signed)
Unsuccessful IV attempt.

## 2016-03-01 ENCOUNTER — Encounter (HOSPITAL_COMMUNITY): Payer: Self-pay | Admitting: Internal Medicine

## 2016-03-01 DIAGNOSIS — A419 Sepsis, unspecified organism: Secondary | ICD-10-CM | POA: Diagnosis present

## 2016-03-01 DIAGNOSIS — E872 Acidosis: Secondary | ICD-10-CM | POA: Diagnosis present

## 2016-03-01 DIAGNOSIS — K219 Gastro-esophageal reflux disease without esophagitis: Secondary | ICD-10-CM | POA: Diagnosis present

## 2016-03-01 DIAGNOSIS — E876 Hypokalemia: Secondary | ICD-10-CM

## 2016-03-01 DIAGNOSIS — K529 Noninfective gastroenteritis and colitis, unspecified: Secondary | ICD-10-CM | POA: Diagnosis present

## 2016-03-01 DIAGNOSIS — E042 Nontoxic multinodular goiter: Secondary | ICD-10-CM | POA: Diagnosis present

## 2016-03-01 DIAGNOSIS — D649 Anemia, unspecified: Secondary | ICD-10-CM | POA: Diagnosis present

## 2016-03-01 DIAGNOSIS — Z8249 Family history of ischemic heart disease and other diseases of the circulatory system: Secondary | ICD-10-CM | POA: Diagnosis not present

## 2016-03-01 DIAGNOSIS — F17200 Nicotine dependence, unspecified, uncomplicated: Secondary | ICD-10-CM | POA: Diagnosis present

## 2016-03-01 DIAGNOSIS — Z79899 Other long term (current) drug therapy: Secondary | ICD-10-CM | POA: Diagnosis not present

## 2016-03-01 DIAGNOSIS — E039 Hypothyroidism, unspecified: Secondary | ICD-10-CM | POA: Diagnosis present

## 2016-03-01 LAB — I-STAT CG4 LACTIC ACID, ED: Lactic Acid, Venous: 1.58 mmol/L (ref 0.5–1.9)

## 2016-03-01 LAB — MAGNESIUM: Magnesium: 1.4 mg/dL — ABNORMAL LOW (ref 1.7–2.4)

## 2016-03-01 LAB — COMPREHENSIVE METABOLIC PANEL
ALBUMIN: 2.7 g/dL — AB (ref 3.5–5.0)
ALK PHOS: 38 U/L (ref 38–126)
ALT: 12 U/L — AB (ref 14–54)
AST: 19 U/L (ref 15–41)
Anion gap: 8 (ref 5–15)
BILIRUBIN TOTAL: 0.5 mg/dL (ref 0.3–1.2)
BUN: 5 mg/dL — AB (ref 6–20)
CO2: 19 mmol/L — ABNORMAL LOW (ref 22–32)
Calcium: 7.1 mg/dL — ABNORMAL LOW (ref 8.9–10.3)
Chloride: 110 mmol/L (ref 101–111)
Creatinine, Ser: 0.85 mg/dL (ref 0.44–1.00)
GFR calc Af Amer: >60 mL/min (ref >60)
GFR calc non Af Amer: >60 mL/min (ref >60)
GLUCOSE: 94 mg/dL (ref 65–99)
POTASSIUM: 3.1 mmol/L — AB (ref 3.5–5.1)
Sodium: 137 mmol/L (ref 135–145)
TOTAL PROTEIN: 5 g/dL — AB (ref 6.5–8.1)

## 2016-03-01 LAB — CBC
HEMATOCRIT: 34.7 % — AB (ref 36.0–46.0)
Hemoglobin: 11.9 g/dL — ABNORMAL LOW (ref 12.0–15.0)
MCH: 31.6 pg (ref 26.0–34.0)
MCHC: 34 g/dL (ref 30.0–36.0)
MCV: 93 fL (ref 78.0–100.0)
Platelets: 249 10*3/uL (ref 150–400)
RBC: 3.73 MIL/uL — ABNORMAL LOW (ref 3.87–5.11)
RDW: 13.1 % (ref 11.5–15.5)
WBC: 10.4 10*3/uL (ref 4.0–10.5)

## 2016-03-01 LAB — LACTIC ACID, PLASMA
LACTIC ACID, VENOUS: 1.9 mmol/L (ref 0.5–1.9)
LACTIC ACID, VENOUS: 1.6 mmol/L (ref 0.5–1.9)
Lactic Acid, Venous: 2.1 mmol/L (ref 0.5–1.9)

## 2016-03-01 LAB — TSH: TSH: 2.495 u[IU]/mL (ref 0.350–4.500)

## 2016-03-01 LAB — PHOSPHORUS: PHOSPHORUS: 2 mg/dL — AB (ref 2.5–4.6)

## 2016-03-01 LAB — PROCALCITONIN: Procalcitonin: 0.63 ng/mL

## 2016-03-01 MED ORDER — PIPERACILLIN-TAZOBACTAM 3.375 G IVPB
3.3750 g | Freq: Three times a day (TID) | INTRAVENOUS | Status: DC
  Administered 2016-03-01 – 2016-03-02 (×3): 3.375 g via INTRAVENOUS
  Filled 2016-03-01 (×6): qty 50

## 2016-03-01 MED ORDER — MAGNESIUM SULFATE 50 % IJ SOLN
3.0000 g | Freq: Once | INTRAVENOUS | Status: AC
  Administered 2016-03-01: 3 g via INTRAVENOUS
  Filled 2016-03-01: qty 6

## 2016-03-01 MED ORDER — PANTOPRAZOLE SODIUM 40 MG PO TBEC
40.0000 mg | DELAYED_RELEASE_TABLET | Freq: Every day | ORAL | Status: DC
  Administered 2016-03-01 – 2016-03-02 (×2): 40 mg via ORAL
  Filled 2016-03-01 (×2): qty 1

## 2016-03-01 MED ORDER — SODIUM CHLORIDE 0.9 % IV BOLUS (SEPSIS): 2.0000 mL | Freq: Once | INTRAVENOUS | Status: DC

## 2016-03-01 MED ORDER — SODIUM CHLORIDE 0.9% FLUSH: 3.0000 mL | Freq: Two times a day (BID) | INTRAVENOUS | Status: DC

## 2016-03-01 MED ORDER — SODIUM CHLORIDE 0.9 % IV BOLUS (SEPSIS)
250.0000 mL | Freq: Once | INTRAVENOUS | Status: AC
  Administered 2016-03-01: 250 mL via INTRAVENOUS

## 2016-03-01 MED ORDER — SODIUM CHLORIDE 0.9 % IV SOLN
30.0000 meq | Freq: Once | INTRAVENOUS | Status: AC
  Administered 2016-03-01: 30 meq via INTRAVENOUS
  Filled 2016-03-01: qty 15

## 2016-03-01 MED ORDER — ONDANSETRON HCL 4 MG PO TABS
4.0000 mg | ORAL_TABLET | Freq: Four times a day (QID) | ORAL | Status: DC | PRN
  Administered 2016-03-01: 4 mg via ORAL
  Filled 2016-03-01: qty 1

## 2016-03-01 MED ORDER — OXYCODONE HCL 5 MG PO TABS
5.0000 mg | ORAL_TABLET | ORAL | Status: DC | PRN
  Administered 2016-03-01 (×2): 5 mg via ORAL
  Filled 2016-03-01 (×2): qty 1

## 2016-03-01 MED ORDER — ENOXAPARIN SODIUM 40 MG/0.4ML ~~LOC~~ SOLN
40.0000 mg | Freq: Every day | SUBCUTANEOUS | Status: DC
  Filled 2016-03-01 (×2): qty 0.4

## 2016-03-01 MED ORDER — TRAMADOL HCL 50 MG PO TABS: 50.0000 mg | ORAL_TABLET | Freq: Four times a day (QID) | ORAL | Status: DC | PRN

## 2016-03-01 MED ORDER — ONDANSETRON HCL 4 MG/2ML IJ SOLN: 4.0000 mg | Freq: Four times a day (QID) | INTRAMUSCULAR | Status: DC | PRN

## 2016-03-01 MED ORDER — PIPERACILLIN-TAZOBACTAM 3.375 G IVPB 30 MIN
3.3750 g | Freq: Once | INTRAVENOUS | Status: AC
  Administered 2016-03-01: 3.375 g via INTRAVENOUS
  Filled 2016-03-01: qty 50

## 2016-03-01 MED ORDER — HYDROCODONE-ACETAMINOPHEN 5-325 MG PO TABS: 1.0000 | ORAL_TABLET | ORAL | Status: DC | PRN

## 2016-03-01 MED ORDER — MAGNESIUM SULFATE 2 GM/50ML IV SOLN
2.0000 g | Freq: Once | INTRAVENOUS | Status: AC
  Administered 2016-03-01: 2 g via INTRAVENOUS
  Filled 2016-03-01: qty 50

## 2016-03-01 MED ORDER — ACETAMINOPHEN 650 MG RE SUPP: 650.0000 mg | Freq: Four times a day (QID) | RECTAL | Status: DC | PRN

## 2016-03-01 MED ORDER — SODIUM CHLORIDE 0.9 % IV BOLUS (SEPSIS)
1000.0000 mL | Freq: Once | INTRAVENOUS | Status: AC
  Administered 2016-03-01: 1000 mL via INTRAVENOUS

## 2016-03-01 MED ORDER — ACETAMINOPHEN 325 MG PO TABS
650.0000 mg | ORAL_TABLET | Freq: Four times a day (QID) | ORAL | Status: DC | PRN
  Administered 2016-03-01 – 2016-03-02 (×2): 650 mg via ORAL
  Filled 2016-03-01 (×2): qty 2

## 2016-03-01 MED ORDER — SODIUM CHLORIDE 0.9 % IV SOLN
INTRAVENOUS | Status: DC
  Administered 2016-03-01: 1000 mL via INTRAVENOUS
  Administered 2016-03-01: 15:00:00 via INTRAVENOUS

## 2016-03-01 MED ORDER — SODIUM CHLORIDE 0.9 % IV SOLN
INTRAVENOUS | Status: DC
  Administered 2016-03-01: 02:00:00 via INTRAVENOUS

## 2016-03-01 MED ORDER — POTASSIUM CHLORIDE CRYS ER 20 MEQ PO TBCR
20.0000 meq | EXTENDED_RELEASE_TABLET | Freq: Three times a day (TID) | ORAL | Status: AC
  Administered 2016-03-01 – 2016-03-02 (×4): 20 meq via ORAL
  Filled 2016-03-01 (×4): qty 1

## 2016-03-01 NOTE — ED Notes (Signed)
Admitting physician at the bedside.

## 2016-03-01 NOTE — Progress Notes (Signed)
[facility] TEAM 1 - Stepdown/ICU TEAM  [name]  [id number] DOB: 02/25/1971 DOA: 02/29/2016 PCP: No PCP Per Patient    Brief Narrative:  45 y.o. female with history of hypothyroidism and GERD who presented with a 2 day history of bilateral upper abdominal pain nausea vomiting and diarrhea.   In the ED she was found to have a pulse of 121 w/ a BP of 83/48.  Lactic acid was 2.82, WBC 18.6 hemoglobin 15.2, and creatinine 0.79.  Limited Abdom US noted no acute right upper quadrant process.  CT abdomen noted only abnormal mild thickening of small bowel loops consistent with enteritis.  Subjective: The pt is feeling better, but is not yet back to her baseline.  She has not yet been able to tolerate significant intake.  She denies cp, sob, HA, or dizziness.  She reports that her diarrhea has resolved, but that her nausea persists.    Assessment & Plan:  Sepsis w/ hypotension and lactic acidosis due to enteritis  Clinically improving - sepsis physiology improving - cont volume expansion - advance diet as able - f/u GI pathogen panel   Hypokalemia Replace and follow - due to GI loss and poor intake   Hypomagnesemia Replace and follow - due to GI loss and poor intake   Mild hypophosphatemia Should correct w/ improved dietary intake   Normocytic anemia Check anemia panel  DVT prophylaxis: Lovenox Code Status: FULL CODE Family Communication: spoke w/ husband and parents at bedside  Disposition Plan: stable for tele bed - cont IVF - slowly advance diet as able   Consultants:  None  Procedures: None  Antimicrobials:  Unasyn 11/25 Ciprofloxacin 11/25 Zosyn 11/25 >  Objective: Blood pressure 105/71, pulse 98, temperature 98.1 F (36.7 C), temperature source Oral, resp. rate 21, weight 75.7 kg (166 lb 14.4 oz), last menstrual period 02/09/2016, SpO2 97 %.  Intake/Output Summary (Last 24 hours) at 03/01/16 0907 Last data filed at 03/01/16 0453  Gross per 24 hour  Intake              4200 ml  Output                0 ml  Net             4200 ml   Filed Weights   02/29/16 1416  Weight: 75.7 kg (166 lb 14.4 oz)    Examination: General: No acute respiratory distress Lungs: Clear to auscultation bilaterally without wheezes or crackles Cardiovascular: Regular rate and rhythm without murmur gallop or rub normal S1 and S2 Abdomen: Nontender, nondistended, soft, bowel sounds positive, no rebound, no ascites, no appreciable mass Extremities: No significant cyanosis, clubbing, or edema bilateral lower extremities  CBC:  Recent Labs Lab 02/29/16 1442 03/01/16 0326  WBC 18.6* 10.4  HGB 15.2* 11.9*  HCT 43.1 34.7*  MCV 92.5 93.0  PLT 308 0000000   Basic Metabolic Panel:  Recent Labs Lab 02/29/16 1442 03/01/16 0326  NA 139 137  K 3.7 3.1*  CL 110 110  CO2 19* 19*  GLUCOSE 108* 94  BUN 9 5*  CREATININE 0.79 0.85  CALCIUM 8.9 7.1*  MG  --  1.4*  PHOS  --  2.0*   GFR: Estimated Creatinine Clearance: 83.3 mL/min (by C-G formula based on SCr of 0.85 mg/dL).  Liver Function Tests:  Recent Labs Lab 02/29/16 1442 03/01/16 0326  AST 26 19  ALT 18 12*  ALKPHOS 51 38  BILITOT 0.5 0.5  PROT  6.5 5.0*  ALBUMIN 3.9 2.7*    Recent Labs Lab 02/29/16 1442  LIPASE 42    Scheduled Meds: . enoxaparin (LOVENOX) injection  40 mg Subcutaneous Daily  . fentaNYL (SUBLIMAZE) injection  50 mcg Intravenous Once  . potassium chloride (KCL MULTIRUN) 30 mEq in 265 mL IVPB  30 mEq Intravenous Once  . sodium chloride flush  3 mL Intravenous Q12H   Continuous Infusions: . sodium chloride 125 mL/hr at 03/01/16 0149  . piperacillin-tazobactam (ZOSYN)  IV    . sodium chloride       LOS: 0 days   Cherene Altes, MD [facility] Office  [phone] Pager - Text Page per Amion as per below:  On-Call/Text Page:      [email or web address]      password [email or web address]  If 7PM-7AM, please contact night-coverage [email or web address] Password [email or web address] 03/01/2016, 9:07  AM

## 2016-03-01 NOTE — ED Notes (Signed)
Admitting Dr at bedside

## 2016-03-02 LAB — RETICULOCYTES
RBC.: 4.43 MIL/uL (ref 3.87–5.11)
RETIC CT PCT: 1 % (ref 0.4–3.1)
Retic Count, Absolute: 44.3 10*3/uL (ref 19.0–186.0)

## 2016-03-02 LAB — IRON AND TIBC
Iron: 7 ug/dL — ABNORMAL LOW (ref 28–170)
SATURATION RATIOS: 2 % — AB (ref 10.4–31.8)
TIBC: 316 ug/dL (ref 250–450)
UIBC: 309 ug/dL

## 2016-03-02 LAB — COMPREHENSIVE METABOLIC PANEL
ALT: 14 U/L (ref 14–54)
ANION GAP: 11 (ref 5–15)
AST: 20 U/L (ref 15–41)
Albumin: 3.4 g/dL — ABNORMAL LOW (ref 3.5–5.0)
Alkaline Phosphatase: 53 U/L (ref 38–126)
BILIRUBIN TOTAL: 0.3 mg/dL (ref 0.3–1.2)
BUN: <5 mg/dL — ABNORMAL LOW (ref 6–20)
CHLORIDE: 108 mmol/L (ref 101–111)
CO2: 18 mmol/L — ABNORMAL LOW (ref 22–32)
Calcium: 8.2 mg/dL — ABNORMAL LOW (ref 8.9–10.3)
Creatinine, Ser: 0.74 mg/dL (ref 0.44–1.00)
Glucose, Bld: 98 mg/dL (ref 65–99)
POTASSIUM: 3.7 mmol/L (ref 3.5–5.1)
Sodium: 137 mmol/L (ref 135–145)
TOTAL PROTEIN: 6.5 g/dL (ref 6.5–8.1)

## 2016-03-02 LAB — CBC
HEMATOCRIT: 41 % (ref 36.0–46.0)
HEMOGLOBIN: 14.1 g/dL (ref 12.0–15.0)
MCH: 31.8 pg (ref 26.0–34.0)
MCHC: 34.4 g/dL (ref 30.0–36.0)
MCV: 92.6 fL (ref 78.0–100.0)
Platelets: 295 10*3/uL (ref 150–400)
RBC: 4.43 MIL/uL (ref 3.87–5.11)
RDW: 12.7 % (ref 11.5–15.5)
WBC: 13 10*3/uL — AB (ref 4.0–10.5)

## 2016-03-02 LAB — FERRITIN: Ferritin: 95 ng/mL (ref 11–307)

## 2016-03-02 LAB — VITAMIN B12: VITAMIN B 12: 400 pg/mL (ref 180–914)

## 2016-03-02 LAB — PHOSPHORUS: PHOSPHORUS: 1.8 mg/dL — AB (ref 2.5–4.6)

## 2016-03-02 LAB — FOLATE: FOLATE: 27.6 ng/mL (ref >5.9)

## 2016-03-02 LAB — MAGNESIUM: Magnesium: 2.3 mg/dL (ref 1.7–2.4)

## 2016-03-02 MED ORDER — ONDANSETRON HCL 4 MG PO TABS: 4.0000 mg | ORAL_TABLET | Freq: Four times a day (QID) | ORAL | 0 refills | Status: DC | PRN

## 2016-03-02 NOTE — Discharge Instructions (Signed)
Viral Gastroenteritis, Adult Introduction Viral gastroenteritis is also known as the stomach flu. This condition is caused by certain germs (viruses). These germs can be passed from person to person very easily (are very contagious). This condition can cause sudden watery poop (diarrhea), fever, and throwing up (vomiting). Having watery poop and throwing up can make you feel weak and cause you to get dehydrated. Dehydration can make you tired and thirsty, make you have a dry mouth, and make it so you pee (urinate) less often. Older adults and people with other diseases or a weak defense system (immune system) are at higher risk for dehydration. It is important to replace the fluids that you lose from having watery poop and throwing up. Follow these instructions at home: Follow instructions from your doctor about how to care for yourself at home. Eating and drinking Follow these instructions as told by your doctor:  Take an oral rehydration solution (ORS). This is a drink that is sold at pharmacies and stores.  Drink clear fluids in small amounts as you are able, such as:  Water.  Ice chips.  Diluted fruit juice.  Low-calorie sports drinks.  Eat bland, easy-to-digest foods in small amounts as you are able, such as:  Bananas.  Applesauce.  Rice.  Low-fat (lean) meats.  Toast.  Crackers.  Avoid fluids that have a lot of sugar or caffeine in them.  Avoid alcohol.  Avoid spicy or fatty foods. General instructions  Drink enough fluid to keep your pee (urine) clear or pale yellow.  Wash your hands often. If you cannot use soap and water, use hand sanitizer.  Make sure that all people in your home wash their hands well and often.  Rest at home while you get better.  Take over-the-counter and prescription medicines only as told by your doctor.  Watch your condition for any changes.  Take a warm bath to help with any burning or pain from having watery poop.  Keep all  follow-up visits as told by your doctor. This is important. Contact a doctor if:  You cannot keep fluids down.  Your symptoms get worse.  You have new symptoms.  You feel light-headed or dizzy.  You have muscle cramps. Get help right away if:  You have chest pain.  You feel very weak or you pass out (faint).  You see blood in your throw-up.  Your throw-up looks like coffee grounds.  You have bloody or black poop (stools) or poop that look like tar.  You have a very bad headache, a stiff neck, or both.  You have a rash.  You have very bad pain, cramping, or bloating in your belly (abdomen).  You have trouble breathing.  You are breathing very quickly.  Your heart is beating very quickly.  Your skin feels cold and clammy.  You feel confused.  You have pain when you pee.  You have signs of dehydration, such as:  Dark pee, hardly any pee, or no pee.  Cracked lips.  Dry mouth.  Sunken eyes.  Sleepiness.  Weakness. This information is not intended to replace advice given to you by your health care provider. Make sure you discuss any questions you have with your health care provider. Document Released: 09/09/2007 Document Revised: 10/11/2015 Document Reviewed: 11/27/2014  2017 Elsevier  

## 2016-03-02 NOTE — Progress Notes (Signed)
Burman Blacksmith to be D/C'd Home per MD order.  Discussed prescriptions and follow up appointments with the patient. Prescriptions given to patient, medication list explained in detail. Pt verbalized understanding.    Medication List    TAKE these medications   acetaminophen 325 MG tablet Commonly known as:  TYLENOL Take 325-650 mg by mouth every 6 (six) hours as needed for headache.   omeprazole 20 MG tablet Commonly known as:  PRILOSEC OTC Take 20 mg by mouth daily as needed (for heartburn).   ondansetron 4 MG tablet Commonly known as:  ZOFRAN Take 1 tablet (4 mg total) by mouth every 6 (six) hours as needed for nausea.       Vitals:   03/02/16 0505 03/02/16 0945  BP: 123/78 112/66  Pulse: (!) 111 99  Resp: 16 16  Temp: 99.5 F (37.5 C) 97.5 F (36.4 C)    Skin clean, dry and intact without evidence of skin break down, no evidence of skin tears noted. IV catheter discontinued intact. Site without signs and symptoms of complications. Dressing and pressure applied. Pt denies pain at this time. No complaints noted.  An After Visit Summary was printed and given to the patient. Patient escorted via [place], and D/C home via private auto.  Emilio Math, RN [facility] Phone [phone]

## 2016-03-02 NOTE — Discharge Summary (Signed)
DISCHARGE SUMMARY  [name]  MR#: [id number]  DOB:28-Oct-1970  Date of Admission: 02/29/2016 Date of Discharge: 03/02/2016  Attending Physician:Vale Mousseau T  Patient's PCP:No PCP Per Patient  Consults:  none  Disposition: D/C home   Follow-up Appts: Follow-up Information    Your usual primary care provider Follow up.   Why:  Follow up with your usual medical care provider if you develop recurrence of diarrhea or inability to tolerate oral intake.           Discharge Diagnoses: Sepsis w/ hypotension and lactic acidosis due to enteritis  Hypokalemia Hypomagnesemia Mild hypophosphatemia  Initial presentation: 45 y.o.femalewith history ofhypothyroidism and GERDwho presented with a 2 day history of bilateral upper abdominal pain nausea vomiting and diarrhea.  In the ED she was found to have a pulse of 121 w/ a BP of 83/48.  Lactic acid was 2.82, WBC 18.6 hemoglobin 15.2, and creatinine 0.79.  Limited Abdom US noted no acute right upper quadrant process.  CT abdomen noted only abnormal mild thickening of small bowel loops consistent with enteritis.  Hospital Course: The patient was admitted to the acute units and aggressively volume resuscitated.  Her blood pressure stabilized.  Her oral intake improved greatly.  Antibiotics were discontinued as clinically there was no significant indication of an actual active bacterial infection.  The patient's risk factor profile for C. difficile was extremely low.  Given her clinical improvement and her tolerance to regular diet a viral etiology was felt to be most likely for her enteritis and antibiotics were not continued at discharge.  Sepsis w/ hypotension and lactic acidosis due to enteritis  sepsis physiology resolved w/ volume expansion - advanced diet back to regular w/o difficutly - GI pathogen panel and C diff testing still not obtained by time of d/c, but felt to be low yield and not worthy of delaying d/c home in  that diarrhea had ceased  Hypokalemia Replaced - due to GI loss and poor intake   Hypomagnesemia Replaced - due to GI loss and poor intake   Mild hypophosphatemia Should correct w/ improved dietary intake      Medication List    TAKE these medications   acetaminophen 325 MG tablet Commonly known as:  TYLENOL Take 325-650 mg by mouth every 6 (six) hours as needed for headache.   omeprazole 20 MG tablet Commonly known as:  PRILOSEC OTC Take 20 mg by mouth daily as needed (for heartburn).   ondansetron 4 MG tablet Commonly known as:  ZOFRAN Take 1 tablet (4 mg total) by mouth every 6 (six) hours as needed for nausea.       Day of Discharge BP 112/66 (BP Location: Left Arm)   Pulse 99   Temp 97.5 F (36.4 C) (Oral)   Resp 16   Ht 5\' 4"  (1.626 m)   Wt 78.6 kg (173 lb 4.8 oz)   LMP 02/09/2016 (Approximate)   SpO2 100%   BMI 29.75 kg/m   Physical Exam: General: No acute respiratory distress Lungs: Clear to auscultation bilaterally without wheezes or crackles Cardiovascular: Regular rate and rhythm without murmur gallop or rub normal S1 and S2 Abdomen: Nontender, nondistended, soft, bowel sounds positive, no rebound, no ascites, no appreciable mass Extremities: No significant cyanosis, clubbing, or edema bilateral lower extremities  Basic Metabolic Panel:  Recent Labs Lab 02/29/16 1442 03/01/16 0326 03/02/16 0639  NA 139 137 137  K 3.7 3.1* 3.7  CL 110 110 108  CO2 19* 19* 18*  GLUCOSE  108* 94 98  BUN 9 5* <5*  CREATININE 0.79 0.85 0.74  CALCIUM 8.9 7.1* 8.2*  MG  --  1.4* 2.3  PHOS  --  2.0* 1.8*    Liver Function Tests:  Recent Labs Lab 02/29/16 1442 03/01/16 0326 03/02/16 0639  AST 26 19 20   ALT 18 12* 14  ALKPHOS 51 38 53  BILITOT 0.5 0.5 0.3  PROT 6.5 5.0* 6.5  ALBUMIN 3.9 2.7* 3.4*    Recent Labs Lab 02/29/16 1442  LIPASE 42   CBC:  Recent Labs Lab 02/29/16 1442 03/01/16 0326 03/02/16 0639  WBC 18.6* 10.4 13.0*  HGB  15.2* 11.9* 14.1  HCT 43.1 34.7* 41.0  MCV 92.5 93.0 92.6  PLT 308 249 295    Recent Results (from the past 240 hour(s))  Culture, blood (x 2)     Status: None (Preliminary result)   Collection Time: 03/01/16  2:15 AM  Result Value Ref Range Status   Specimen Description BLOOD LEFT ARM  Final   Special Requests BOTTLES DRAWN AEROBIC AND ANAEROBIC 5ML  Final   Culture NO GROWTH 1 DAY  Final   Report Status PENDING  Incomplete  Culture, blood (x 2)     Status: None (Preliminary result)   Collection Time: 03/01/16  2:25 AM  Result Value Ref Range Status   Specimen Description BLOOD LEFT HAND  Final   Special Requests BOTTLES DRAWN AEROBIC AND ANAEROBIC 5ML  Final   Culture NO GROWTH 1 DAY  Final   Report Status PENDING  Incomplete     Time spent in discharge (includes decision making & examination of pt): <30 minutes  03/02/2016, 11:50 AM   Cherene Altes, MD [facility] Office  [phone] Pager [phone]  On-Call/Text Page:      [email or web address]      password [facility]

## 2016-03-06 LAB — CULTURE, BLOOD (ROUTINE X 2)
CULTURE: NO GROWTH
Culture: NO GROWTH

## 2016-04-30 ENCOUNTER — Other Ambulatory Visit: Payer: Self-pay | Admitting: Obstetrics and Gynecology

## 2016-06-10 ENCOUNTER — Encounter (HOSPITAL_COMMUNITY): Payer: Self-pay | Admitting: *Deleted

## 2016-06-12 ENCOUNTER — Ambulatory Visit (HOSPITAL_COMMUNITY): Payer: BLUE CROSS/BLUE SHIELD | Admitting: Certified Registered Nurse Anesthetist

## 2016-06-12 ENCOUNTER — Encounter (HOSPITAL_COMMUNITY)
Admission: RE | Disposition: A | Discharge status: Home / Self Care | Payer: Self-pay | Source: Ambulatory Visit | Attending: Obstetrics and Gynecology

## 2016-06-12 ENCOUNTER — Encounter (HOSPITAL_COMMUNITY): Payer: Self-pay | Admitting: *Deleted

## 2016-06-12 ENCOUNTER — Ambulatory Visit (HOSPITAL_COMMUNITY)
Admission: RE | Admit: 2016-06-12 | Discharge: 2016-06-12 | Disposition: A | Discharge status: Home / Self Care | Payer: BLUE CROSS/BLUE SHIELD | Source: Ambulatory Visit | Attending: Obstetrics and Gynecology | Admitting: Obstetrics and Gynecology

## 2016-06-12 DIAGNOSIS — K219 Gastro-esophageal reflux disease without esophagitis: Secondary | ICD-10-CM | POA: Insufficient documentation

## 2016-06-12 DIAGNOSIS — N840 Polyp of corpus uteri: Secondary | ICD-10-CM | POA: Diagnosis not present

## 2016-06-12 DIAGNOSIS — Z79899 Other long term (current) drug therapy: Secondary | ICD-10-CM | POA: Insufficient documentation

## 2016-06-12 DIAGNOSIS — Z87891 Personal history of nicotine dependence: Secondary | ICD-10-CM | POA: Insufficient documentation

## 2016-06-12 DIAGNOSIS — N920 Excessive and frequent menstruation with regular cycle: Secondary | ICD-10-CM | POA: Insufficient documentation

## 2016-06-12 DIAGNOSIS — F419 Anxiety disorder, unspecified: Secondary | ICD-10-CM | POA: Diagnosis not present

## 2016-06-12 HISTORY — DX: Headache, unspecified: R51.9

## 2016-06-12 HISTORY — PX: DILATATION & CURETTAGE/HYSTEROSCOPY WITH MYOSURE: SHX6511

## 2016-06-12 HISTORY — DX: Hypothyroidism, unspecified: E03.9

## 2016-06-12 HISTORY — DX: Gastro-esophageal reflux disease without esophagitis: K21.9

## 2016-06-12 HISTORY — DX: Headache: R51

## 2016-06-12 HISTORY — DX: Anxiety disorder, unspecified: F41.9

## 2016-06-12 LAB — CBC
HEMATOCRIT: 41.2 % (ref 36.0–46.0)
HEMOGLOBIN: 14 g/dL (ref 12.0–15.0)
MCH: 31.7 pg (ref 26.0–34.0)
MCHC: 34 g/dL (ref 30.0–36.0)
MCV: 93.4 fL (ref 78.0–100.0)
Platelets: 321 10*3/uL (ref 150–400)
RBC: 4.41 MIL/uL (ref 3.87–5.11)
RDW: 12.8 % (ref 11.5–15.5)
WBC: 8.3 10*3/uL (ref 4.0–10.5)

## 2016-06-12 SURGERY — DILATATION & CURETTAGE/HYSTEROSCOPY WITH MYOSURE
Anesthesia: General | Site: Vagina

## 2016-06-12 MED ORDER — KETOROLAC TROMETHAMINE 30 MG/ML IJ SOLN
INTRAMUSCULAR | Status: AC
  Filled 2016-06-12: qty 1

## 2016-06-12 MED ORDER — PROPOFOL 10 MG/ML IV BOLUS
INTRAVENOUS | Status: DC | PRN
  Administered 2016-06-12: 150 mg via INTRAVENOUS
  Administered 2016-06-12: 50 mg via INTRAVENOUS
  Administered 2016-06-12: 20 mg via INTRAVENOUS

## 2016-06-12 MED ORDER — ALBUTEROL SULFATE HFA 108 (90 BASE) MCG/ACT IN AERS
INHALATION_SPRAY | RESPIRATORY_TRACT | Status: AC
  Filled 2016-06-12: qty 6.7

## 2016-06-12 MED ORDER — MIDAZOLAM HCL 2 MG/2ML IJ SOLN
INTRAMUSCULAR | Status: DC | PRN
  Administered 2016-06-12: 2 mg via INTRAVENOUS

## 2016-06-12 MED ORDER — ACETAMINOPHEN 325 MG PO TABS: 325.0000 mg | ORAL_TABLET | ORAL | Status: DC | PRN

## 2016-06-12 MED ORDER — SCOPOLAMINE 1 MG/3DAYS TD PT72
1.0000 | MEDICATED_PATCH | Freq: Once | TRANSDERMAL | Status: DC
  Administered 2016-06-12: 1.5 mg via TRANSDERMAL

## 2016-06-12 MED ORDER — KETOROLAC TROMETHAMINE 30 MG/ML IJ SOLN: 30.0000 mg | Freq: Once | INTRAMUSCULAR | Status: DC

## 2016-06-12 MED ORDER — PROPOFOL 10 MG/ML IV BOLUS
INTRAVENOUS | Status: AC
  Filled 2016-06-12: qty 20

## 2016-06-12 MED ORDER — DEXAMETHASONE SODIUM PHOSPHATE 10 MG/ML IJ SOLN
INTRAMUSCULAR | Status: DC | PRN
  Administered 2016-06-12: 4 mg via INTRAVENOUS

## 2016-06-12 MED ORDER — ALBUTEROL SULFATE HFA 108 (90 BASE) MCG/ACT IN AERS
INHALATION_SPRAY | RESPIRATORY_TRACT | Status: DC | PRN
  Administered 2016-06-12: 2 via RESPIRATORY_TRACT

## 2016-06-12 MED ORDER — MEPERIDINE HCL 25 MG/ML IJ SOLN: 6.2500 mg | INTRAMUSCULAR | Status: DC | PRN

## 2016-06-12 MED ORDER — SCOPOLAMINE 1 MG/3DAYS TD PT72
MEDICATED_PATCH | TRANSDERMAL | Status: AC
  Administered 2016-06-12: 1.5 mg via TRANSDERMAL
  Filled 2016-06-12: qty 1

## 2016-06-12 MED ORDER — EPHEDRINE SULFATE 50 MG/ML IJ SOLN
INTRAMUSCULAR | Status: DC | PRN
  Administered 2016-06-12: 5 mg via INTRAVENOUS

## 2016-06-12 MED ORDER — ONDANSETRON HCL 4 MG/2ML IJ SOLN: 4.0000 mg | Freq: Once | INTRAMUSCULAR | Status: DC | PRN

## 2016-06-12 MED ORDER — IBUPROFEN 800 MG PO TABS: 800.0000 mg | ORAL_TABLET | Freq: Three times a day (TID) | ORAL | 0 refills | Status: AC | PRN

## 2016-06-12 MED ORDER — FENTANYL CITRATE (PF) 100 MCG/2ML IJ SOLN: 25.0000 ug | INTRAMUSCULAR | Status: DC | PRN

## 2016-06-12 MED ORDER — ONDANSETRON HCL 4 MG/2ML IJ SOLN
INTRAMUSCULAR | Status: DC | PRN
Start: 2016-06-12 — End: 2016-06-12
  Administered 2016-06-12: 4 mg via INTRAVENOUS

## 2016-06-12 MED ORDER — ONDANSETRON HCL 4 MG/2ML IJ SOLN
INTRAMUSCULAR | Status: AC
  Filled 2016-06-12: qty 2

## 2016-06-12 MED ORDER — LACTATED RINGERS IV SOLN
INTRAVENOUS | Status: DC
  Administered 2016-06-12: 08:00:00 via INTRAVENOUS

## 2016-06-12 MED ORDER — LIDOCAINE HCL (CARDIAC) 20 MG/ML IV SOLN
INTRAVENOUS | Status: DC | PRN
  Administered 2016-06-12: 100 mg via INTRAVENOUS

## 2016-06-12 MED ORDER — OXYCODONE HCL 5 MG PO TABS: 5.0000 mg | ORAL_TABLET | Freq: Once | ORAL | Status: DC | PRN

## 2016-06-12 MED ORDER — ACETAMINOPHEN 160 MG/5ML PO SOLN
325.0000 mg | ORAL | Status: DC | PRN
Start: 2016-06-12 — End: 2016-06-12

## 2016-06-12 MED ORDER — FENTANYL CITRATE (PF) 100 MCG/2ML IJ SOLN
INTRAMUSCULAR | Status: AC
  Filled 2016-06-12: qty 2

## 2016-06-12 MED ORDER — FENTANYL CITRATE (PF) 100 MCG/2ML IJ SOLN
INTRAMUSCULAR | Status: DC | PRN
  Administered 2016-06-12: 50 ug via INTRAVENOUS

## 2016-06-12 MED ORDER — OXYCODONE HCL 5 MG/5ML PO SOLN: 5.0000 mg | Freq: Once | ORAL | Status: DC | PRN

## 2016-06-12 MED ORDER — SODIUM CHLORIDE 0.9 % IR SOLN
Status: DC | PRN
  Administered 2016-06-12: 3000 mL

## 2016-06-12 MED ORDER — MIDAZOLAM HCL 2 MG/2ML IJ SOLN
INTRAMUSCULAR | Status: AC
Start: 2016-06-12 — End: 2016-06-12
  Filled 2016-06-12: qty 2

## 2016-06-12 MED ORDER — LIDOCAINE HCL (CARDIAC) 20 MG/ML IV SOLN
INTRAVENOUS | Status: AC
  Filled 2016-06-12: qty 5

## 2016-06-12 MED ORDER — DEXAMETHASONE SODIUM PHOSPHATE 4 MG/ML IJ SOLN
INTRAMUSCULAR | Status: AC
  Filled 2016-06-12: qty 1

## 2016-06-12 SURGICAL SUPPLY — 20 items
CANISTER SUCT 3000ML (MISCELLANEOUS) ×3 IMPLANT
CATH ROBINSON RED A/P 16FR (CATHETERS) ×3 IMPLANT
CLOTH BEACON ORANGE TIMEOUT ST (SAFETY) ×3 IMPLANT
CONTAINER PREFILL 10% NBF 60ML (FORM) ×6 IMPLANT
DEVICE MYOSURE LITE (MISCELLANEOUS) ×2 IMPLANT
DEVICE MYOSURE REACH (MISCELLANEOUS) IMPLANT
ELECT REM PT RETURN 9FT ADLT (ELECTROSURGICAL) ×3
ELECTRODE REM PT RTRN 9FT ADLT (ELECTROSURGICAL) ×1 IMPLANT
FILTER ARTHROSCOPY CONVERTOR (FILTER) ×3 IMPLANT
GLOVE BIOGEL PI IND STRL 7.0 (GLOVE) ×2 IMPLANT
GLOVE BIOGEL PI INDICATOR 7.0 (GLOVE) ×4
GLOVE ECLIPSE 6.5 STRL STRAW (GLOVE) ×3 IMPLANT
GOWN STRL REUS W/TWL LRG LVL3 (GOWN DISPOSABLE) ×6 IMPLANT
PACK VAGINAL MINOR WOMEN LF (CUSTOM PROCEDURE TRAY) ×3 IMPLANT
PAD OB MATERNITY 4.3X12.25 (PERSONAL CARE ITEMS) ×3 IMPLANT
SEAL ROD LENS SCOPE MYOSURE (ABLATOR) ×3 IMPLANT
TOWEL OR 17X24 6PK STRL BLUE (TOWEL DISPOSABLE) ×6 IMPLANT
TUBING AQUILEX INFLOW (TUBING) ×3 IMPLANT
TUBING AQUILEX OUTFLOW (TUBING) ×3 IMPLANT
WATER STERILE IRR 1000ML POUR (IV SOLUTION) ×3 IMPLANT

## 2016-06-12 NOTE — Transfer of Care (Signed)
Immediate Anesthesia Transfer of Care Note  Patient: [name]  Procedure(s) Performed: Procedure(s): DILATATION & CURETTAGE/HYSTEROSCOPY WITH MYOSURE (N/A)  Patient Location: PACU  Anesthesia Type:General  Level of Consciousness: awake, alert  and oriented  Airway & Oxygen Therapy: Patient Spontanous Breathing and Patient connected to nasal cannula oxygen  Post-op Assessment: Report given to RN, Post -op Vital signs reviewed and stable and Patient moving all extremities  Post vital signs: Reviewed and stable  Last Vitals:  Vitals:   06/12/16 0738  BP: 112/86  Pulse: 94  Resp: 18  Temp: 36.6 C    Last Pain:  Vitals:   06/12/16 0738  TempSrc: Oral  PainSc: 0-No pain      Patients Stated Pain Goal: 3 (25/74/93 5521)  Complications: No apparent anesthesia complications

## 2016-06-12 NOTE — Op Note (Signed)
[name], [name]                ACCOUNT NO.:  [id number]  MEDICAL RECORD NO.:  [id number]  LOCATION:                                 FACILITY:  PHYSICIAN:  Servando Salina, M.D.    DATE OF BIRTH:  DATE OF PROCEDURE:  06/12/2016 DATE OF DISCHARGE:                              OPERATIVE REPORT   PREOPERATIVE DIAGNOSES:  Menorrhagia with regular cycles, endometrial mass.  PROCEDURES:  Diagnostic hysteroscopy, hysteroscopic resection of endometrial polyp, dilation and curettage.  POSTOPERATIVE DIAGNOSES:  Menorrhagia with regular cycle, endometrial polyp.  ANESTHESIA:  General.  SURGEON:  Servando Salina, M.D.  ASSISTANT:  None.  DESCRIPTION OF PROCEDURE:  Under adequate general anesthesia, the patient was placed in the dorsal lithotomy position.  She was sterilely prepped and draped in usual fashion.  The bladder was catheterized for moderate amount of urine.  Examination under anesthesia revealed anteverted uterus.  No adnexal masses could be appreciated.  A bivalve speculum was placed in vagina and single-tooth tenaculum was placed on anterior lip of the cervix.  The cervix was then serially dilated up to #21 Pratt dilator and MyoSure hysteroscope was introduced into the uterine cavity.  Both tubal ostia could be seen. In the posterior lower uterine segment was polypoid thickened lesion, a LITE MyoSure resectoscope was then inserted.  Lower segment polypoid area was resected as well as any other endometrial thickening that was noted throughout the cavity.  The instrument was then removed.  The cavity was then gently curetted, and then all instruments were then removed from the vagina.  SPECIMEN:  Labeled endometrial curetting with polyp was sent to Pathology.  ESTIMATED BLOOD LOSS:  15 mL.  COMPLICATION:  None.  The patient tolerated the procedure well, was transferred to recovery in stable condition.     Servando Salina,  M.D.   ______________________________ Servando Salina, M.D.    [facility]/MEDQ  D:  06/12/2016  T:  06/12/2016  Job:  [id number]

## 2016-06-12 NOTE — Discharge Instructions (Signed)
DISCHARGE INSTRUCTIONS: D&C / Hysteroscopy The following instructions have been prepared to help you care for yourself upon your return home.   Personal hygiene:  Use sanitary pads for vaginal drainage, not tampons.  Shower the day after your procedure.  NO tub baths, pools or Jacuzzis for 2-3 weeks.  Wipe front to back after using the bathroom.  Activity and limitations:  Do NOT drive or operate any equipment for 24 hours. The effects of anesthesia are still present and drowsiness may result.  Do NOT rest in bed all day.  Walking is encouraged.  Walk up and down stairs slowly.  You may resume your normal activity in one to two days or as indicated by your physician.  Sexual activity: NO intercourse for 1 week after the procedure, or as indicated by your physician.  Diet: Eat a light meal as desired this evening. You may resume your usual diet tomorrow.  Return to work: You may resume your work activities in one to two days or as indicated by your doctor.  What to expect after your surgery: Expect to have vaginal bleeding/discharge for 2-3 days and spotting for up to 10 days. It is not unusual to have soreness for up to 1-2 weeks. You may have a slight burning sensation when you urinate for the first day. Mild cramps may continue for a couple of days. You may have a regular period in 2-6 weeks.  Call your doctor for any of the following:  Excessive vaginal bleeding, saturating and changing one pad every hour.  Inability to urinate 6 hours after discharge from hospital.  Pain not relieved by pain medication.  Fever of 100.4 F or greater.  Unusual vaginal discharge or odor.     Patients signature: ______________________  Nurses signature ________________________  Support person's signature_______________________

## 2016-06-12 NOTE — Anesthesia Preprocedure Evaluation (Signed)
Anesthesia Evaluation  Patient identified by MRN, date of birth, ID band Patient awake    Reviewed: Allergy & Precautions, H&P , NPO status , Patient's Chart, lab work & pertinent test results  Airway Mallampati: I  TM Distance: >3 FB Neck ROM: full    Dental no notable dental hx. (+) Teeth Intact   Pulmonary neg pulmonary ROS, former smoker,    Pulmonary exam normal        Cardiovascular negative cardio ROS Normal cardiovascular exam     Neuro/Psych    GI/Hepatic negative GI ROS, Neg liver ROS, GERD  Medicated and Controlled,  Endo/Other    Renal/GU negative Renal ROS     Musculoskeletal   Abdominal Normal abdominal exam  (+)   Peds  Hematology negative hematology ROS (+)   Anesthesia Other Findings   Reproductive/Obstetrics negative OB ROS                             Anesthesia Physical Anesthesia Plan  ASA: II  Anesthesia Plan: General   Post-op Pain Management:    Induction: Intravenous  Airway Management Planned: LMA  Additional Equipment:   Intra-op Plan:   Post-operative Plan:   Informed Consent: I have reviewed the patients History and Physical, chart, labs and discussed the procedure including the risks, benefits and alternatives for the proposed anesthesia with the patient or authorized representative who has indicated his/her understanding and acceptance.   Dental Advisory Given and History available from chart only  Plan Discussed with: CRNA and Surgeon  Anesthesia Plan Comments:         Anesthesia Quick Evaluation

## 2016-06-12 NOTE — H&P (Signed)
[name] is an 46 y.o. female. WF presents for surgical mgmt of endom mass noted on sono hysterogram done for endometrial thickening for menorrhagia  Pertinent Gynecological History: Menses: menorrhagia with reg cycle Bleeding: heavy Contraception: none DES exposure: denies Blood transfusions: none Sexually transmitted diseases: no past history Previous GYN Procedures: DNC  Last mammogram: normal Date: 01/17/2016 Last pap: normal Date: 01/2016 OB History: G2, P1011   Menstrual History: Menarche age: n/a No LMP recorded.    Past Medical History:  Diagnosis Date  . Anxiety   . GERD (gastroesophageal reflux disease)    OCCASIONAL  . Headache   . Hypothyroidism    NO MEDS CURRENTLY  . Thyroid disease     Past Surgical History:  Procedure Laterality Date  . DIAGNOSTIC LAPAROSCOPY     OVARIAN CYST  . NASAL SEPTUM SURGERY  1988  . WISDOM TOOTH EXTRACTION  1992    Family History  Problem Relation Age of Onset  . Hypertension Mother   . Hyperlipidemia Mother   . Hypertension Father     Social History:  reports that she quit smoking about a year ago. Her smoking use included Cigarettes. She has a 6.25 pack-year smoking history. She has never used smokeless tobacco. She reports that she drinks alcohol. She reports that she does not use drugs.  Allergies: No Known Allergies  No prescriptions prior to admission.    Review of Systems  Gastrointestinal: Positive for abdominal pain.  All other systems reviewed and are negative.   Height 5\' 4"  (1.626 m), weight 77.1 kg (170 lb). Physical Exam  Constitutional: She is oriented to person, place, and time. She appears well-developed and well-nourished.  HENT:  Head: Atraumatic.  Eyes: EOM are normal.  Neck: Neck supple.  Cardiovascular: Regular rhythm.   Respiratory: Breath sounds normal.  GI: Soft.  Genitourinary: Vagina normal and uterus normal.  Musculoskeletal: Normal range of motion.  Neurological: She is  alert and oriented to person, place, and time. She has normal reflexes.  Skin: Skin is warm and dry.  Psychiatric: She has a normal mood and affect.  cervix nl Vulva nl  No results found for this or any previous visit (from the past 24 hour(s)).  No results found.  Assessment/Plan: Menorrhagia with regular cycle Endometrial mass P) dx hysteroscopy, D&C, resection of endometrial mass.risk of surgery reviewed including Infection, bleeding, injury to surrounding organ structures, internal scar tissue, thermal injury, Uterine perforation and its risk   Shadd Dunstan A 06/12/2016, 7:15 AM

## 2016-06-12 NOTE — Anesthesia Procedure Notes (Signed)
Procedure Name: LMA Insertion Date/Time: 06/12/2016 8:28 AM Performed by: Hewitt Blade Pre-anesthesia Checklist: Patient identified, Emergency Drugs available, Suction available and Patient being monitored Patient Re-evaluated:Patient Re-evaluated prior to inductionOxygen Delivery Method: Circle system utilized Preoxygenation: Pre-oxygenation with 100% oxygen Intubation Type: IV induction LMA: LMA inserted LMA Size: 4.0 Number of attempts: 1 Placement Confirmation: positive ETCO2 and breath sounds checked- equal and bilateral Tube secured with: Tape Dental Injury: Teeth and Oropharynx as per pre-operative assessment

## 2016-06-12 NOTE — Anesthesia Postprocedure Evaluation (Addendum)
Anesthesia Post Note  Patient: [name]  Procedure(s) Performed: Procedure(s) (LRB): DILATATION & CURETTAGE/HYSTEROSCOPY WITH MYOSURE (N/A)  Patient location during evaluation: PACU Anesthesia Type: General Level of consciousness: awake Pain management: pain level controlled Vital Signs Assessment: post-procedure vital signs reviewed and stable Respiratory status: spontaneous breathing Postop Assessment: no signs of nausea or vomiting Anesthetic complications: no       Last Vitals:  Vitals:   06/12/16 0930 06/12/16 0945  BP: 128/88 129/87  Pulse: (!) 101 97  Resp: 13 16  Temp:      Last Pain:  Vitals:   06/12/16 0945  TempSrc:   PainSc: 0-No pain                 Alayza Pieper JR,JOHN Omunique Pederson

## 2016-06-12 NOTE — Brief Op Note (Signed)
06/12/2016  9:08 AM  PATIENT:  Burman Blacksmith  46 y.o. female  PRE-OPERATIVE DIAGNOSIS:  Menorrhagia with regular cycle, Endometrial Mass  POST-OPERATIVE DIAGNOSIS:  Menorrhagia with regular cycle, Endometrial polyp  PROCEDURE:  Diagnostic hysteroscopy, hysteroscopic resection of endometrial polyp using myosure,  Dilation and curettage  SURGEON:  Surgeon(s) and Role:    * Servando Salina, MD - Primary  PHYSICIAN ASSISTANT:   ASSISTANTS: none   ANESTHESIA:   general FINDINGS: posterior endometrial thickening with polyps, tubal ostia seen EBL:  Total I/O In: -  Out: 65 [Urine:50; Blood:15]  BLOOD ADMINISTERED:none  DRAINS: none   LOCAL MEDICATIONS USED:  NONE  SPECIMEN:  Source of Specimen:  EMC with polyp  DISPOSITION OF SPECIMEN:  PATHOLOGY  COUNTS:  YES  TOURNIQUET:  * No tourniquets in log *  DICTATION: .Other Dictation: Dictation Number [id number]  PLAN OF CARE: Admit to inpatient   PATIENT DISPOSITION:  PACU - hemodynamically stable.   Delay start of Pharmacological VTE agent (>24hrs) due to surgical blood loss or risk of bleeding: no

## 2016-06-15 ENCOUNTER — Encounter (HOSPITAL_COMMUNITY): Payer: Self-pay | Admitting: Obstetrics and Gynecology

## 2016-09-11 NOTE — Addendum Note (Signed)
Addendum  created 09/11/16 1043 by Lyn Hollingshead, MD   Sign clinical note

## 2016-10-22 ENCOUNTER — Other Ambulatory Visit: Payer: Self-pay | Admitting: Family Medicine

## 2016-10-22 DIAGNOSIS — I700 Atherosclerosis of aorta: Secondary | ICD-10-CM

## 2016-10-28 ENCOUNTER — Ambulatory Visit
Admission: RE | Admit: 2016-10-28 | Discharge: 2016-10-28 | Disposition: A | Discharge status: Home / Self Care | Payer: BLUE CROSS/BLUE SHIELD | Source: Ambulatory Visit | Attending: Family Medicine | Admitting: Family Medicine

## 2016-10-28 ENCOUNTER — Other Ambulatory Visit: Payer: Self-pay | Admitting: Family Medicine

## 2016-10-28 DIAGNOSIS — M25552 Pain in left hip: Secondary | ICD-10-CM

## 2016-10-28 DIAGNOSIS — I700 Atherosclerosis of aorta: Secondary | ICD-10-CM

## 2017-01-11 IMAGING — US US ABDOMEN LIMITED
1 series · 14 of 25 positions shown · non-contrast
Comparison: CT abdomen and pelvis February 29, 2016 at 5012 hours

CLINICAL DATA: Epigastric pain.

EXAM:
US ABDOMEN LIMITED - RIGHT UPPER QUADRANT

[Series 1: us abdomen limited · 0.20mm/px · 14 of 40 slices shown]
[im 1/40]
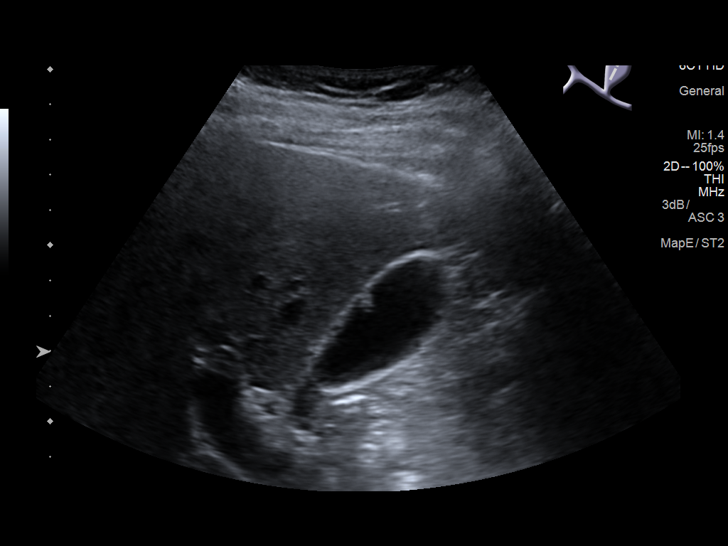
[im 4/40]
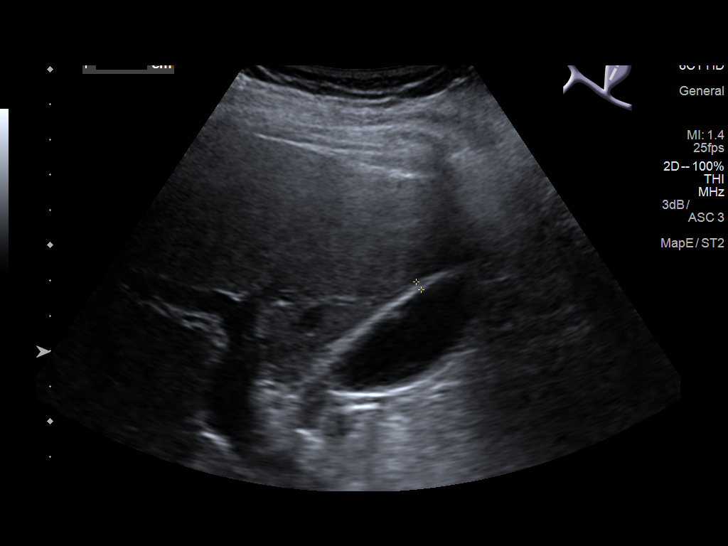
[im 7/40]
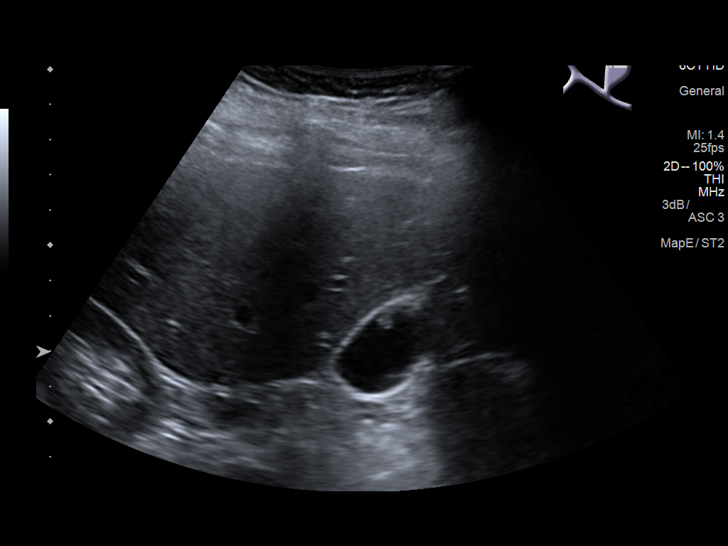
[im 10/40]
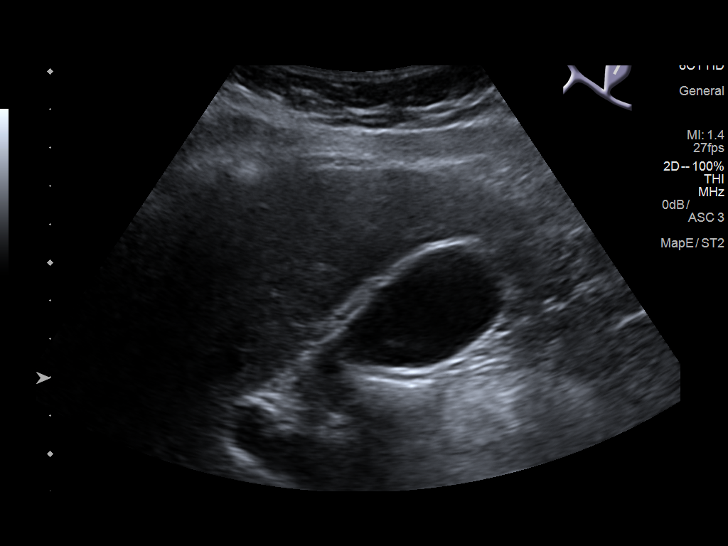
[im 14/40]
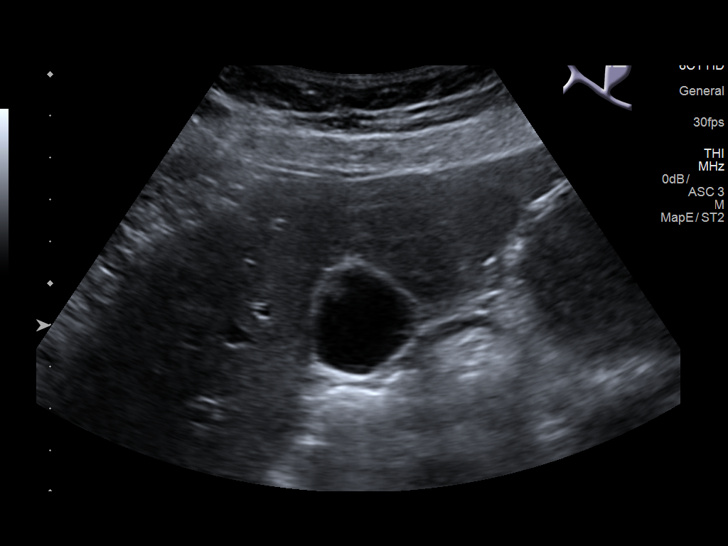
[im 15/40]
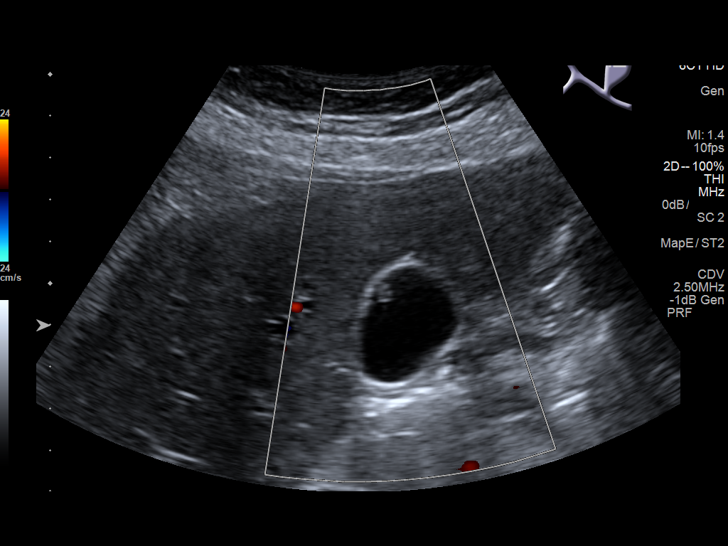
[im 18/40]
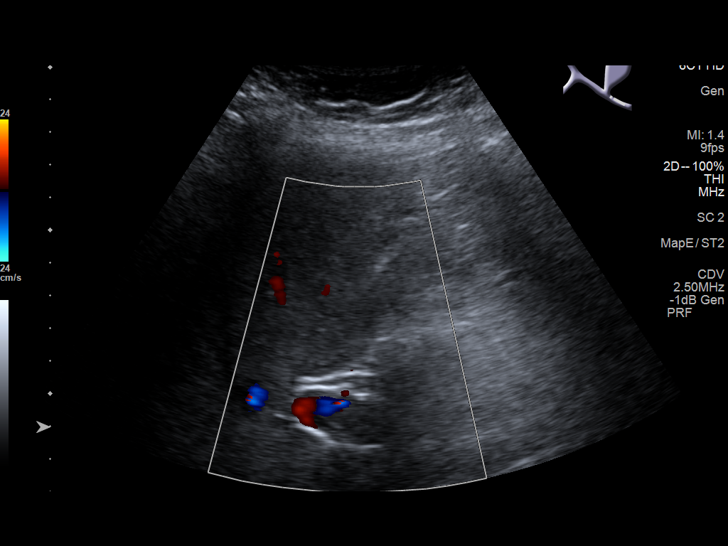
[im 22/40]
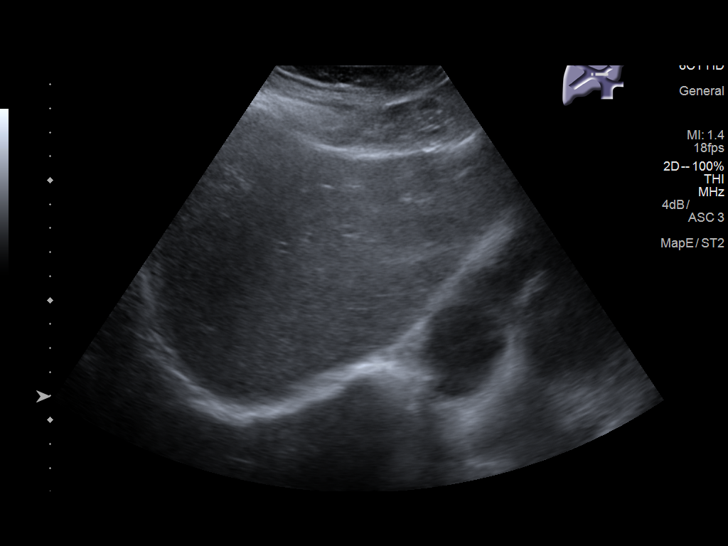
[im 25/40]
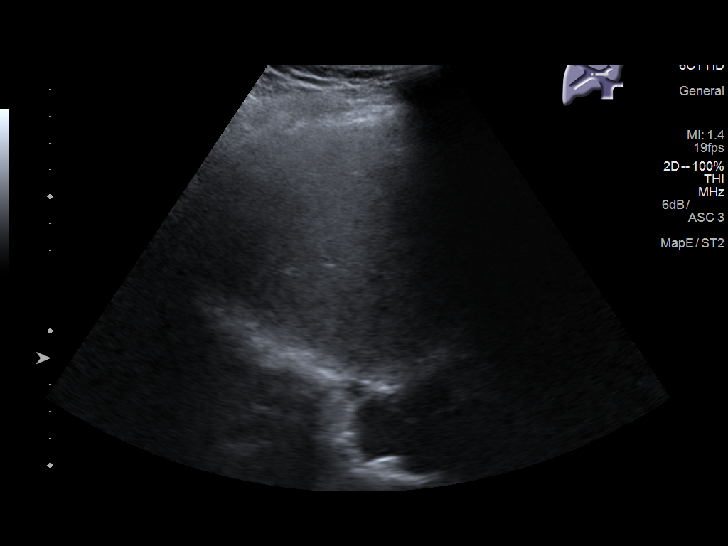
[im 27/40]
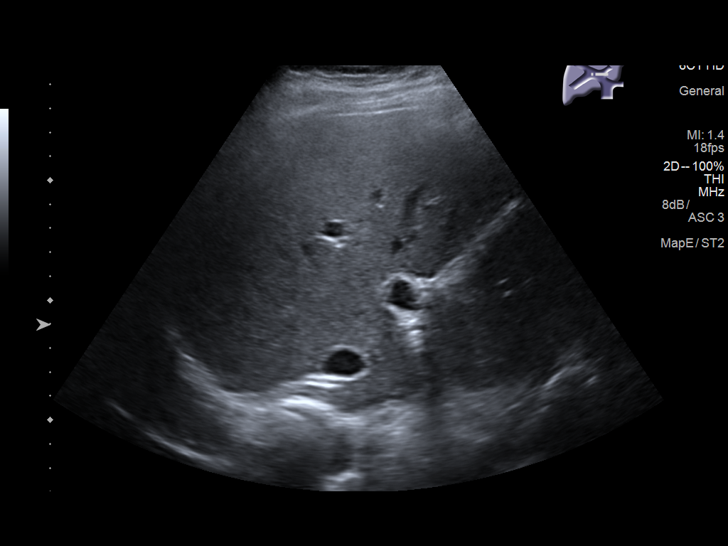
[im 30/40]
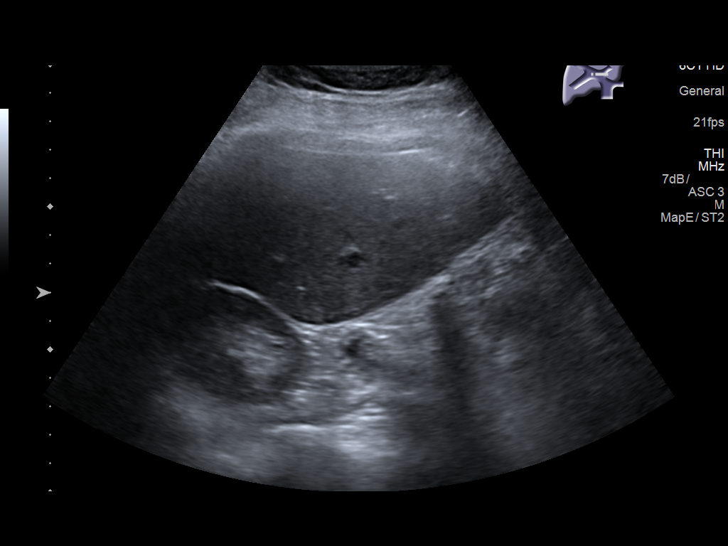
[im 33/40]
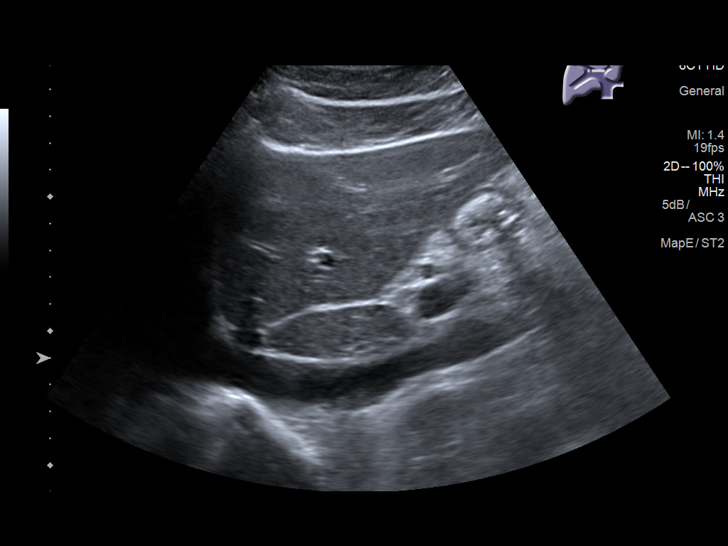
[im 36/40]
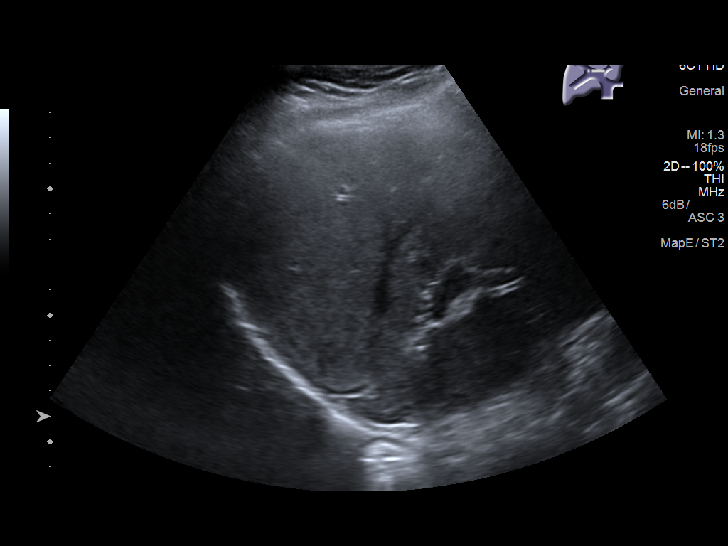
[im 40/40]
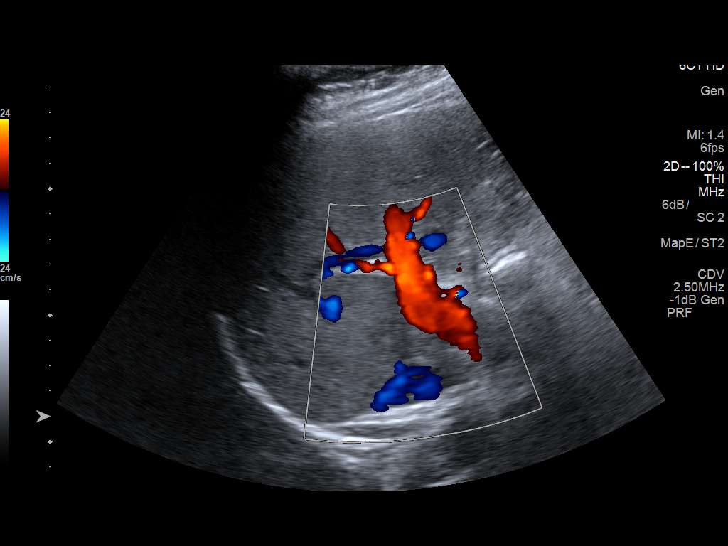

[14 of 25 positions shown; findings below may reference images not displayed]

FINDINGS: Gallbladder:

No gallstones or significant wall thickening visualized. Echogenic
non shadowing 6 mm mural based polyp. No indicated follow-up. No
sonographic Murphy sign noted by sonographer.

Common bile duct:

Diameter: 4 mm.

Liver:

No focal lesion identified. Within normal limits in parenchymal
echogenicity. Hepatopetal portal vein.
IMPRESSION: No acute RIGHT upper quadrant process.

## 2017-04-28 DIAGNOSIS — Z1231 Encounter for screening mammogram for malignant neoplasm of breast: Secondary | ICD-10-CM | POA: Diagnosis not present

## 2017-04-28 DIAGNOSIS — Z1151 Encounter for screening for human papillomavirus (HPV): Secondary | ICD-10-CM | POA: Diagnosis not present

## 2017-04-28 DIAGNOSIS — Z01419 Encounter for gynecological examination (general) (routine) without abnormal findings: Secondary | ICD-10-CM | POA: Diagnosis not present

## 2017-04-28 DIAGNOSIS — Z6833 Body mass index (BMI) 33.0-33.9, adult: Secondary | ICD-10-CM | POA: Diagnosis not present

## 2017-04-29 DIAGNOSIS — N951 Menopausal and female climacteric states: Secondary | ICD-10-CM | POA: Diagnosis not present

## 2017-04-30 DIAGNOSIS — E039 Hypothyroidism, unspecified: Secondary | ICD-10-CM | POA: Diagnosis not present

## 2017-04-30 DIAGNOSIS — N951 Menopausal and female climacteric states: Secondary | ICD-10-CM | POA: Diagnosis not present

## 2017-04-30 DIAGNOSIS — R6882 Decreased libido: Secondary | ICD-10-CM | POA: Diagnosis not present

## 2017-04-30 DIAGNOSIS — R5383 Other fatigue: Secondary | ICD-10-CM | POA: Diagnosis not present

## 2017-05-13 DIAGNOSIS — Z23 Encounter for immunization: Secondary | ICD-10-CM | POA: Diagnosis not present

## 2017-05-25 DIAGNOSIS — R1012 Left upper quadrant pain: Secondary | ICD-10-CM | POA: Diagnosis not present

## 2017-05-25 DIAGNOSIS — R1032 Left lower quadrant pain: Secondary | ICD-10-CM | POA: Diagnosis not present

## 2017-05-25 DIAGNOSIS — R194 Change in bowel habit: Secondary | ICD-10-CM | POA: Diagnosis not present

## 2017-05-25 DIAGNOSIS — K625 Hemorrhage of anus and rectum: Secondary | ICD-10-CM | POA: Diagnosis not present

## 2017-06-01 DIAGNOSIS — E669 Obesity, unspecified: Secondary | ICD-10-CM | POA: Diagnosis not present

## 2017-06-01 DIAGNOSIS — N951 Menopausal and female climacteric states: Secondary | ICD-10-CM | POA: Diagnosis not present

## 2017-06-02 DIAGNOSIS — E559 Vitamin D deficiency, unspecified: Secondary | ICD-10-CM | POA: Diagnosis not present

## 2017-06-02 DIAGNOSIS — Z1331 Encounter for screening for depression: Secondary | ICD-10-CM | POA: Diagnosis not present

## 2017-06-02 DIAGNOSIS — R7301 Impaired fasting glucose: Secondary | ICD-10-CM | POA: Diagnosis not present

## 2017-06-02 DIAGNOSIS — E782 Mixed hyperlipidemia: Secondary | ICD-10-CM | POA: Diagnosis not present

## 2017-06-02 DIAGNOSIS — E039 Hypothyroidism, unspecified: Secondary | ICD-10-CM | POA: Diagnosis not present

## 2017-06-03 DIAGNOSIS — R87618 Other abnormal cytological findings on specimens from cervix uteri: Secondary | ICD-10-CM | POA: Diagnosis not present

## 2017-06-08 DIAGNOSIS — E039 Hypothyroidism, unspecified: Secondary | ICD-10-CM | POA: Diagnosis not present

## 2017-07-14 DIAGNOSIS — Z79899 Other long term (current) drug therapy: Secondary | ICD-10-CM | POA: Diagnosis not present

## 2017-07-14 DIAGNOSIS — F902 Attention-deficit hyperactivity disorder, combined type: Secondary | ICD-10-CM | POA: Diagnosis not present

## 2018-08-19 DIAGNOSIS — F902 Attention-deficit hyperactivity disorder, combined type: Secondary | ICD-10-CM | POA: Diagnosis not present

## 2018-08-19 DIAGNOSIS — F419 Anxiety disorder, unspecified: Secondary | ICD-10-CM | POA: Diagnosis not present

## 2018-08-19 DIAGNOSIS — Z79899 Other long term (current) drug therapy: Secondary | ICD-10-CM | POA: Diagnosis not present

## 2018-08-19 DIAGNOSIS — F338 Other recurrent depressive disorders: Secondary | ICD-10-CM | POA: Diagnosis not present

## 2018-09-29 DIAGNOSIS — F322 Major depressive disorder, single episode, severe without psychotic features: Secondary | ICD-10-CM | POA: Diagnosis not present

## 2018-10-06 DIAGNOSIS — F411 Generalized anxiety disorder: Secondary | ICD-10-CM | POA: Diagnosis not present

## 2018-10-06 DIAGNOSIS — E039 Hypothyroidism, unspecified: Secondary | ICD-10-CM | POA: Diagnosis not present

## 2018-10-06 DIAGNOSIS — F331 Major depressive disorder, recurrent, moderate: Secondary | ICD-10-CM | POA: Diagnosis not present

## 2018-10-10 DIAGNOSIS — F322 Major depressive disorder, single episode, severe without psychotic features: Secondary | ICD-10-CM | POA: Diagnosis not present

## 2018-10-27 DIAGNOSIS — F322 Major depressive disorder, single episode, severe without psychotic features: Secondary | ICD-10-CM | POA: Diagnosis not present

## 2018-11-10 DIAGNOSIS — F902 Attention-deficit hyperactivity disorder, combined type: Secondary | ICD-10-CM | POA: Diagnosis not present

## 2018-11-10 DIAGNOSIS — F338 Other recurrent depressive disorders: Secondary | ICD-10-CM | POA: Diagnosis not present

## 2018-11-10 DIAGNOSIS — F419 Anxiety disorder, unspecified: Secondary | ICD-10-CM | POA: Diagnosis not present

## 2018-11-10 DIAGNOSIS — Z79899 Other long term (current) drug therapy: Secondary | ICD-10-CM | POA: Diagnosis not present

## 2018-11-16 DIAGNOSIS — F322 Major depressive disorder, single episode, severe without psychotic features: Secondary | ICD-10-CM | POA: Diagnosis not present

## 2018-11-22 DIAGNOSIS — F322 Major depressive disorder, single episode, severe without psychotic features: Secondary | ICD-10-CM | POA: Diagnosis not present

## 2018-12-02 DIAGNOSIS — F322 Major depressive disorder, single episode, severe without psychotic features: Secondary | ICD-10-CM | POA: Diagnosis not present

## 2018-12-09 DIAGNOSIS — B373 Candidiasis of vulva and vagina: Secondary | ICD-10-CM | POA: Diagnosis not present

## 2018-12-09 DIAGNOSIS — Z114 Encounter for screening for human immunodeficiency virus [HIV]: Secondary | ICD-10-CM | POA: Diagnosis not present

## 2018-12-09 DIAGNOSIS — Z1231 Encounter for screening mammogram for malignant neoplasm of breast: Secondary | ICD-10-CM | POA: Diagnosis not present

## 2018-12-09 DIAGNOSIS — Z113 Encounter for screening for infections with a predominantly sexual mode of transmission: Secondary | ICD-10-CM | POA: Diagnosis not present

## 2018-12-09 DIAGNOSIS — Z1159 Encounter for screening for other viral diseases: Secondary | ICD-10-CM | POA: Diagnosis not present

## 2018-12-09 DIAGNOSIS — Z124 Encounter for screening for malignant neoplasm of cervix: Secondary | ICD-10-CM | POA: Diagnosis not present

## 2018-12-09 DIAGNOSIS — Z01419 Encounter for gynecological examination (general) (routine) without abnormal findings: Secondary | ICD-10-CM | POA: Diagnosis not present

## 2018-12-09 DIAGNOSIS — Z6831 Body mass index (BMI) 31.0-31.9, adult: Secondary | ICD-10-CM | POA: Diagnosis not present

## 2018-12-09 DIAGNOSIS — N898 Other specified noninflammatory disorders of vagina: Secondary | ICD-10-CM | POA: Diagnosis not present

## 2018-12-09 DIAGNOSIS — Z118 Encounter for screening for other infectious and parasitic diseases: Secondary | ICD-10-CM | POA: Diagnosis not present

## 2018-12-09 DIAGNOSIS — Z1151 Encounter for screening for human papillomavirus (HPV): Secondary | ICD-10-CM | POA: Diagnosis not present

## 2018-12-13 DIAGNOSIS — F322 Major depressive disorder, single episode, severe without psychotic features: Secondary | ICD-10-CM | POA: Diagnosis not present

## 2018-12-25 IMAGING — US US AORTA
1 series · 9 of 9 positions shown · non-contrast
Comparison: 10/01/2016 CT

CLINICAL DATA: Aortic atherosclerosis

EXAM:
ULTRASOUND OF ABDOMINAL AORTA
TECHNIQUE: Ultrasound examination of the abdominal aorta was performed to
evaluate for abdominal aortic aneurysm.

[Series 1: us aorta · 0.18mm/px · 9 of 9 slices shown]
[im 1/9]
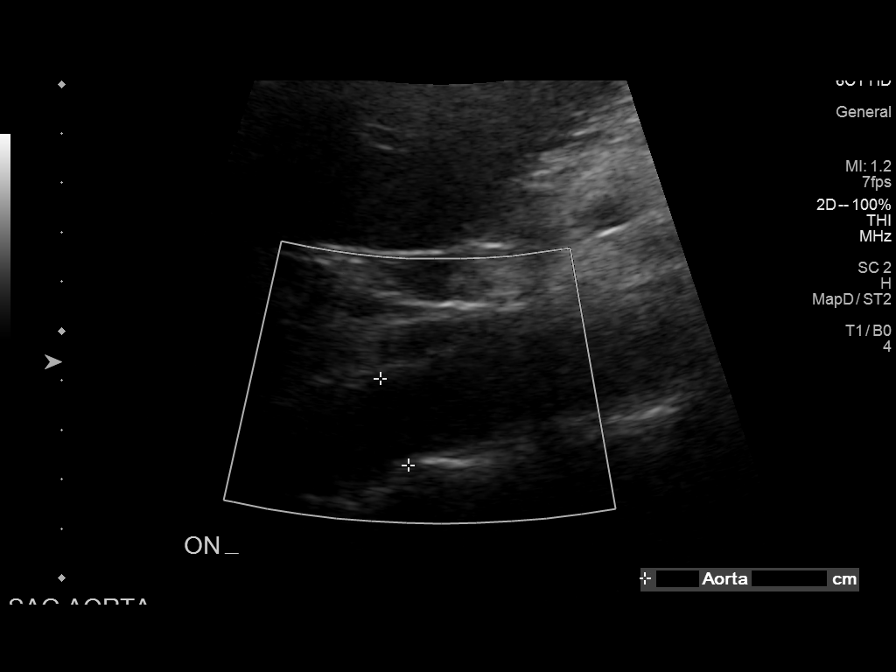
[im 2/9]
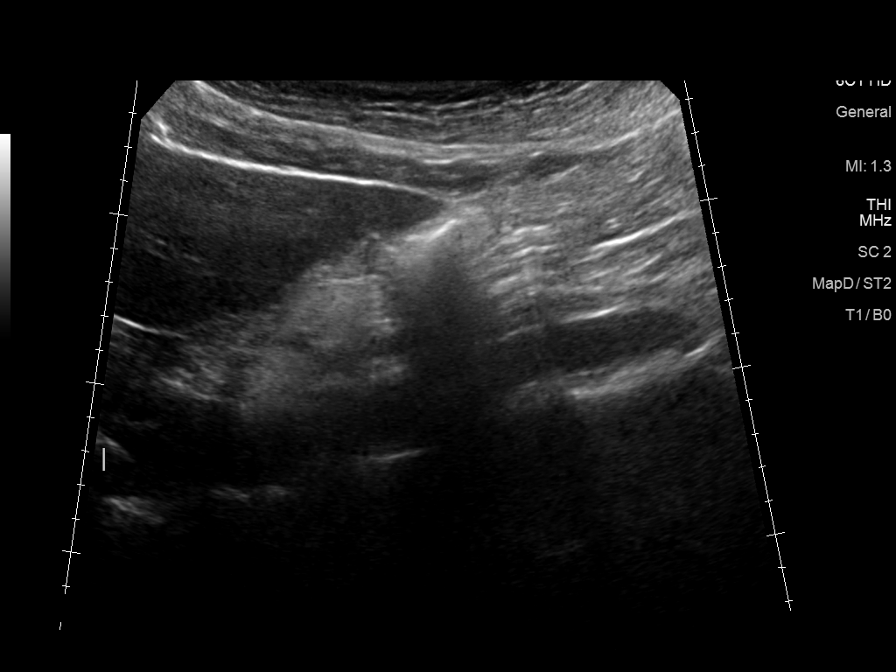
[im 3/9]
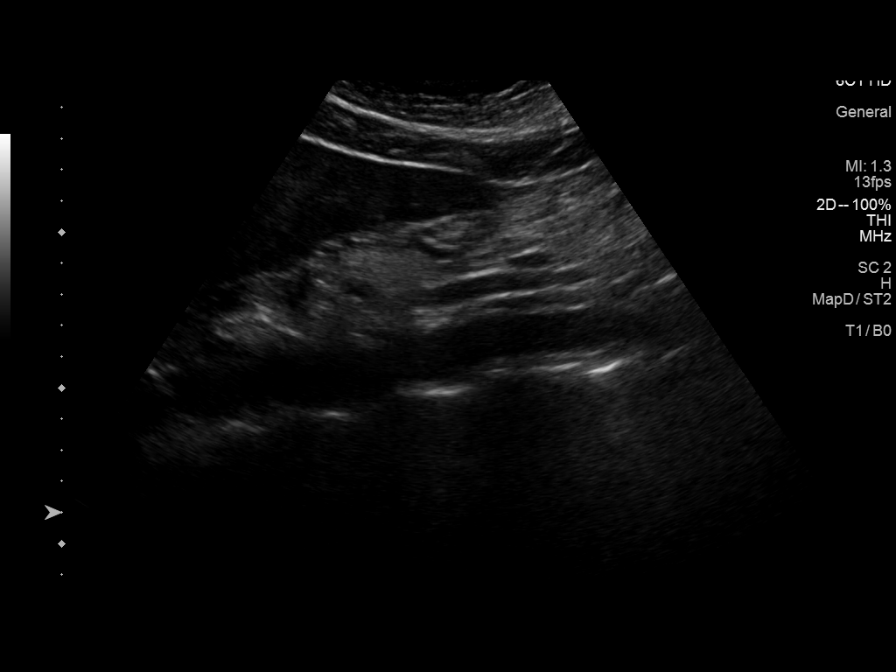
[im 4/9]
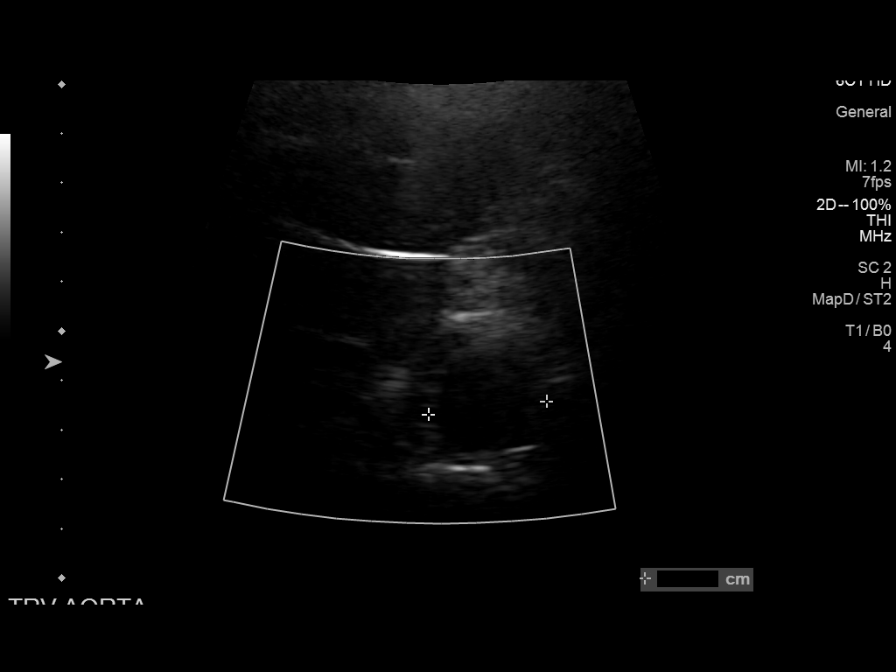
[im 5/9]
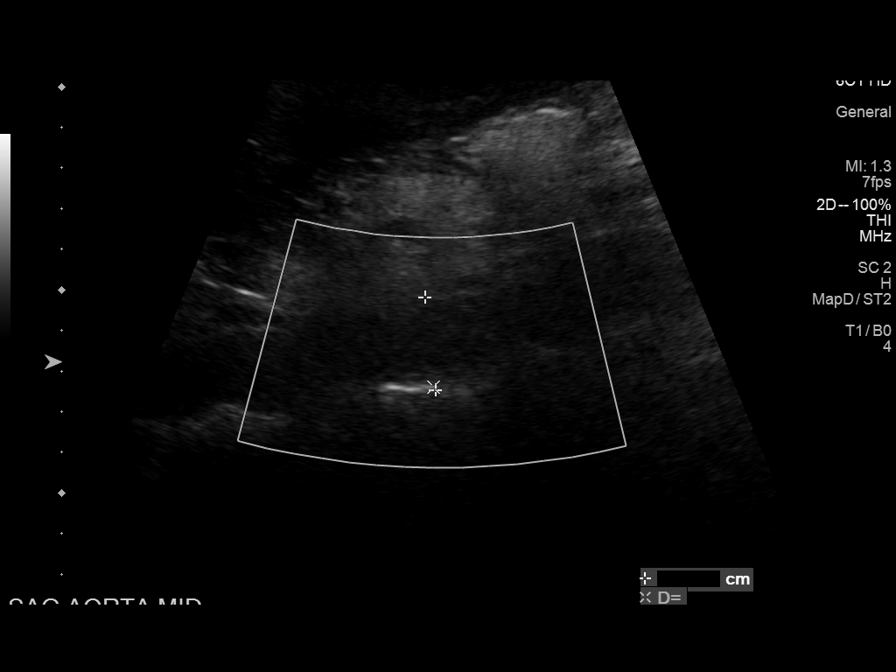
[im 6/9]
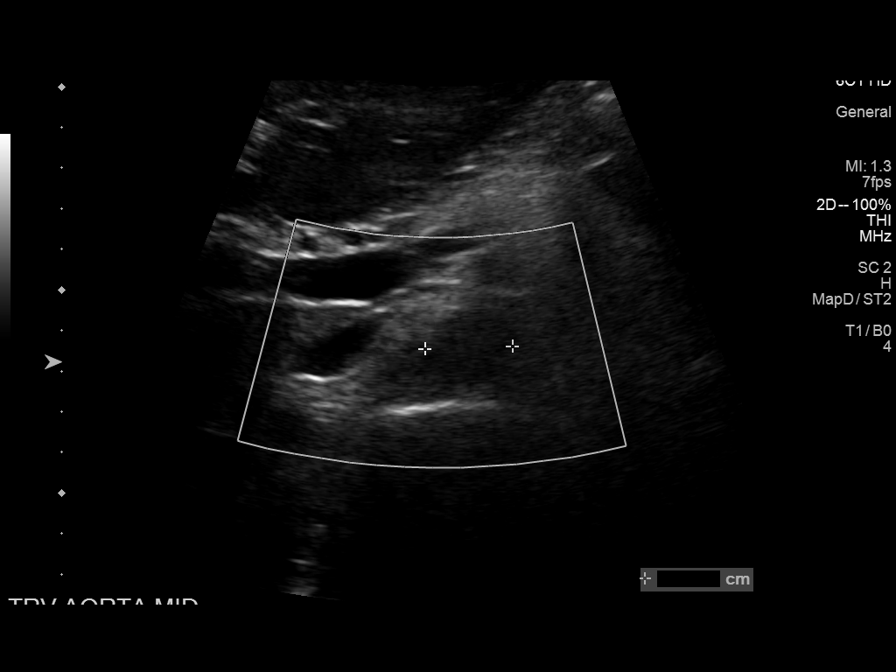
[im 7/9]
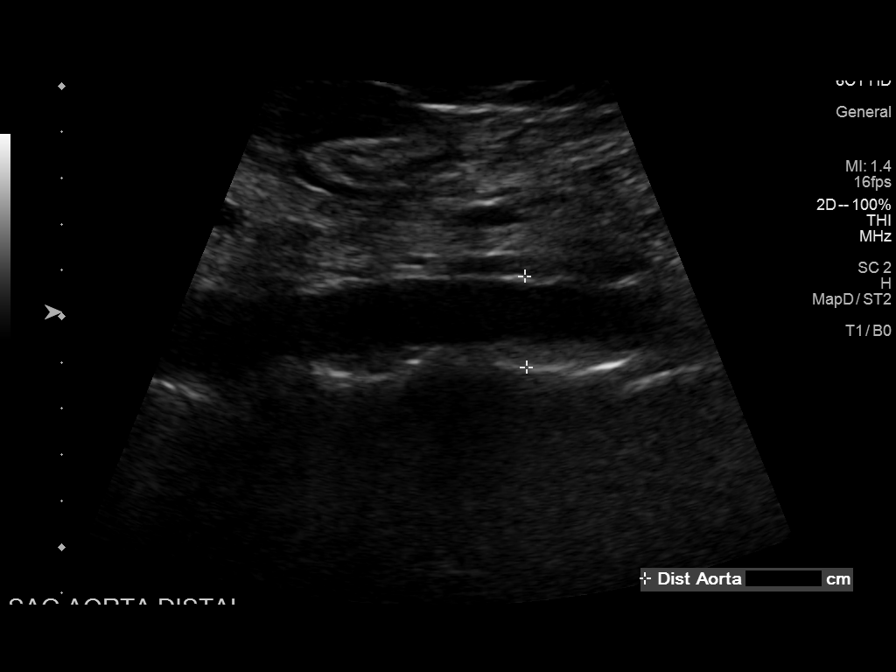
[im 8/9]
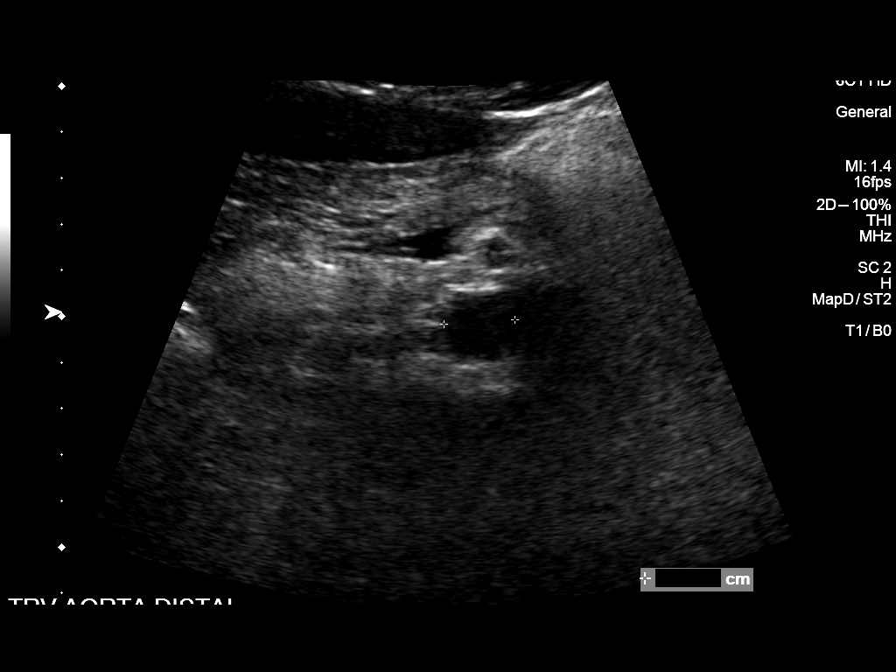
[im 9/9]
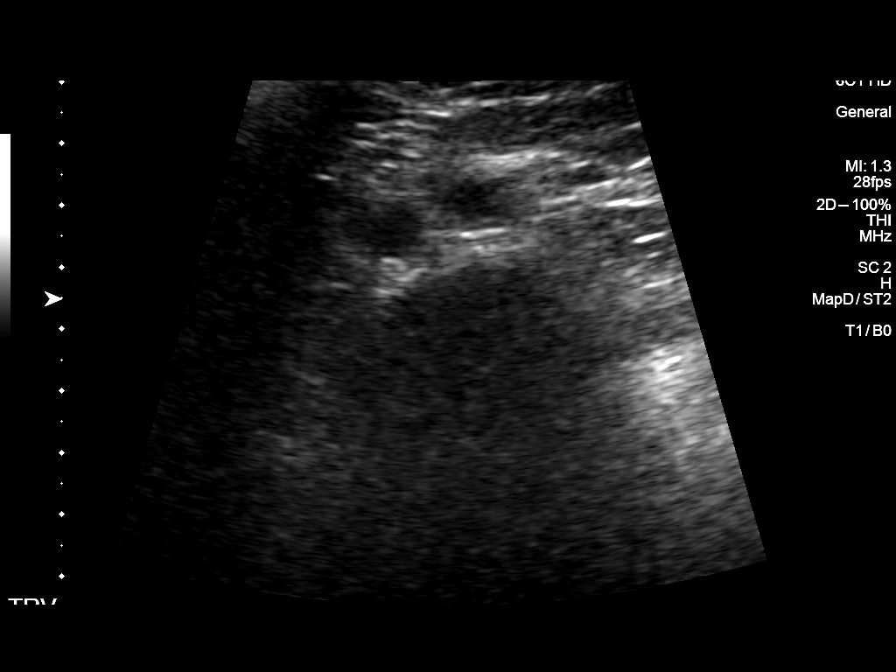

[9 of 9 positions shown; findings below may reference images not displayed]

FINDINGS: Abdominal aortic measurements as follows:

Proximal:  2.4 cm

Mid:  2.3 cm

Distal:  2.0 cm
IMPRESSION: No evidence of abdominal aortic aneurysm.

## 2018-12-27 DIAGNOSIS — R109 Unspecified abdominal pain: Secondary | ICD-10-CM | POA: Diagnosis not present

## 2018-12-27 DIAGNOSIS — R194 Change in bowel habit: Secondary | ICD-10-CM | POA: Diagnosis not present

## 2018-12-27 DIAGNOSIS — F322 Major depressive disorder, single episode, severe without psychotic features: Secondary | ICD-10-CM | POA: Diagnosis not present

## 2019-01-09 DIAGNOSIS — F322 Major depressive disorder, single episode, severe without psychotic features: Secondary | ICD-10-CM | POA: Diagnosis not present

## 2019-01-19 DIAGNOSIS — F322 Major depressive disorder, single episode, severe without psychotic features: Secondary | ICD-10-CM | POA: Diagnosis not present

## 2019-01-24 DIAGNOSIS — F322 Major depressive disorder, single episode, severe without psychotic features: Secondary | ICD-10-CM | POA: Diagnosis not present

## 2019-01-31 DIAGNOSIS — F322 Major depressive disorder, single episode, severe without psychotic features: Secondary | ICD-10-CM | POA: Diagnosis not present

## 2019-02-10 DIAGNOSIS — G5631 Lesion of radial nerve, right upper limb: Secondary | ICD-10-CM | POA: Diagnosis not present

## 2019-02-10 DIAGNOSIS — M47812 Spondylosis without myelopathy or radiculopathy, cervical region: Secondary | ICD-10-CM | POA: Diagnosis not present

## 2019-02-22 DIAGNOSIS — F331 Major depressive disorder, recurrent, moderate: Secondary | ICD-10-CM | POA: Diagnosis not present

## 2019-02-22 DIAGNOSIS — E039 Hypothyroidism, unspecified: Secondary | ICD-10-CM | POA: Diagnosis not present

## 2019-02-22 DIAGNOSIS — F411 Generalized anxiety disorder: Secondary | ICD-10-CM | POA: Diagnosis not present

## 2019-02-22 DIAGNOSIS — F900 Attention-deficit hyperactivity disorder, predominantly inattentive type: Secondary | ICD-10-CM | POA: Diagnosis not present

## 2019-03-06 ENCOUNTER — Other Ambulatory Visit: Payer: Self-pay

## 2019-03-06 DIAGNOSIS — Z20822 Contact with and (suspected) exposure to covid-19: Secondary | ICD-10-CM

## 2019-03-07 LAB — NOVEL CORONAVIRUS, NAA: SARS-CoV-2, NAA: NOT DETECTED

## 2019-03-08 DIAGNOSIS — F322 Major depressive disorder, single episode, severe without psychotic features: Secondary | ICD-10-CM | POA: Diagnosis not present

## 2019-03-09 DIAGNOSIS — E039 Hypothyroidism, unspecified: Secondary | ICD-10-CM | POA: Diagnosis not present

## 2019-03-10 DIAGNOSIS — F900 Attention-deficit hyperactivity disorder, predominantly inattentive type: Secondary | ICD-10-CM | POA: Diagnosis not present

## 2019-03-10 DIAGNOSIS — Z6832 Body mass index (BMI) 32.0-32.9, adult: Secondary | ICD-10-CM | POA: Diagnosis not present

## 2019-03-10 DIAGNOSIS — E039 Hypothyroidism, unspecified: Secondary | ICD-10-CM | POA: Diagnosis not present

## 2019-03-14 DIAGNOSIS — Z23 Encounter for immunization: Secondary | ICD-10-CM | POA: Diagnosis not present

## 2019-03-15 DIAGNOSIS — F322 Major depressive disorder, single episode, severe without psychotic features: Secondary | ICD-10-CM | POA: Diagnosis not present

## 2019-03-28 DIAGNOSIS — F322 Major depressive disorder, single episode, severe without psychotic features: Secondary | ICD-10-CM | POA: Diagnosis not present

## 2019-04-12 DIAGNOSIS — R0683 Snoring: Secondary | ICD-10-CM | POA: Diagnosis not present

## 2019-04-12 DIAGNOSIS — R0681 Apnea, not elsewhere classified: Secondary | ICD-10-CM | POA: Diagnosis not present

## 2019-04-19 DIAGNOSIS — F322 Major depressive disorder, single episode, severe without psychotic features: Secondary | ICD-10-CM | POA: Diagnosis not present

## 2019-04-22 DIAGNOSIS — T7840XA Allergy, unspecified, initial encounter: Secondary | ICD-10-CM | POA: Diagnosis not present

## 2019-05-02 DIAGNOSIS — F322 Major depressive disorder, single episode, severe without psychotic features: Secondary | ICD-10-CM | POA: Diagnosis not present

## 2019-05-03 ENCOUNTER — Ambulatory Visit: Payer: BLUE CROSS/BLUE SHIELD | Attending: Internal Medicine

## 2019-05-03 DIAGNOSIS — Z20822 Contact with and (suspected) exposure to covid-19: Secondary | ICD-10-CM

## 2019-05-03 DIAGNOSIS — L309 Dermatitis, unspecified: Secondary | ICD-10-CM | POA: Diagnosis not present

## 2019-05-03 DIAGNOSIS — H109 Unspecified conjunctivitis: Secondary | ICD-10-CM | POA: Diagnosis not present

## 2019-05-03 DIAGNOSIS — R519 Headache, unspecified: Secondary | ICD-10-CM | POA: Diagnosis not present

## 2019-05-04 LAB — NOVEL CORONAVIRUS, NAA: SARS-CoV-2, NAA: NOT DETECTED

## 2019-05-08 DIAGNOSIS — F322 Major depressive disorder, single episode, severe without psychotic features: Secondary | ICD-10-CM | POA: Diagnosis not present

## 2019-06-02 DIAGNOSIS — F902 Attention-deficit hyperactivity disorder, combined type: Secondary | ICD-10-CM | POA: Diagnosis not present

## 2019-06-02 DIAGNOSIS — Z79899 Other long term (current) drug therapy: Secondary | ICD-10-CM | POA: Diagnosis not present

## 2019-06-02 DIAGNOSIS — F338 Other recurrent depressive disorders: Secondary | ICD-10-CM | POA: Diagnosis not present

## 2019-06-02 DIAGNOSIS — F419 Anxiety disorder, unspecified: Secondary | ICD-10-CM | POA: Diagnosis not present

## 2019-07-13 ENCOUNTER — Telehealth: Payer: Self-pay | Admitting: Endocrinology

## 2019-07-13 NOTE — Telephone Encounter (Signed)
Pt submitted a request to be seen for a NP appt via internet, LMTCB so I can let her know what we need to proceed.

## 2019-07-25 DIAGNOSIS — F332 Major depressive disorder, recurrent severe without psychotic features: Secondary | ICD-10-CM | POA: Diagnosis not present

## 2019-07-29 DIAGNOSIS — F332 Major depressive disorder, recurrent severe without psychotic features: Secondary | ICD-10-CM | POA: Diagnosis not present

## 2019-09-27 DIAGNOSIS — F338 Other recurrent depressive disorders: Secondary | ICD-10-CM | POA: Diagnosis not present

## 2019-09-27 DIAGNOSIS — Z79899 Other long term (current) drug therapy: Secondary | ICD-10-CM | POA: Diagnosis not present

## 2019-09-27 DIAGNOSIS — F902 Attention-deficit hyperactivity disorder, combined type: Secondary | ICD-10-CM | POA: Diagnosis not present

## 2019-09-27 DIAGNOSIS — F419 Anxiety disorder, unspecified: Secondary | ICD-10-CM | POA: Diagnosis not present

## 2020-04-25 ENCOUNTER — Other Ambulatory Visit: Payer: Self-pay | Admitting: Obstetrics and Gynecology

## 2020-04-25 DIAGNOSIS — N6310 Unspecified lump in the right breast, unspecified quadrant: Secondary | ICD-10-CM

## 2020-06-06 ENCOUNTER — Other Ambulatory Visit: Payer: BLUE CROSS/BLUE SHIELD

## 2021-08-25 ENCOUNTER — Other Ambulatory Visit: Payer: Self-pay | Admitting: Physician Assistant

## 2021-08-25 DIAGNOSIS — Z1231 Encounter for screening mammogram for malignant neoplasm of breast: Secondary | ICD-10-CM

## 2021-10-24 ENCOUNTER — Other Ambulatory Visit: Payer: Self-pay

## 2021-10-24 ENCOUNTER — Emergency Department (HOSPITAL_BASED_OUTPATIENT_CLINIC_OR_DEPARTMENT_OTHER): Payer: Medicaid Other

## 2021-10-24 ENCOUNTER — Emergency Department (HOSPITAL_BASED_OUTPATIENT_CLINIC_OR_DEPARTMENT_OTHER)
Admission: EM | Admit: 2021-10-24 | Discharge: 2021-10-24 | Disposition: A | Discharge status: Home / Self Care | Payer: Medicaid Other | Attending: Emergency Medicine | Admitting: Emergency Medicine

## 2021-10-24 ENCOUNTER — Encounter (HOSPITAL_BASED_OUTPATIENT_CLINIC_OR_DEPARTMENT_OTHER): Payer: Self-pay

## 2021-10-24 DIAGNOSIS — R109 Unspecified abdominal pain: Secondary | ICD-10-CM | POA: Diagnosis present

## 2021-10-24 DIAGNOSIS — D259 Leiomyoma of uterus, unspecified: Secondary | ICD-10-CM | POA: Diagnosis not present

## 2021-10-24 DIAGNOSIS — R110 Nausea: Secondary | ICD-10-CM

## 2021-10-24 DIAGNOSIS — R112 Nausea with vomiting, unspecified: Secondary | ICD-10-CM | POA: Diagnosis not present

## 2021-10-24 DIAGNOSIS — R1032 Left lower quadrant pain: Secondary | ICD-10-CM

## 2021-10-24 LAB — URINALYSIS, ROUTINE W REFLEX MICROSCOPIC
Bilirubin Urine: NEGATIVE
Glucose, UA: NEGATIVE mg/dL
Ketones, ur: NEGATIVE mg/dL
Leukocytes,Ua: NEGATIVE
Nitrite: NEGATIVE
Specific Gravity, Urine: 1.025 (ref 1.005–1.030)
pH: 5.5 (ref 5.0–8.0)

## 2021-10-24 LAB — CBC
HCT: 44.6 % (ref 36.0–46.0)
Hemoglobin: 15.2 g/dL — ABNORMAL HIGH (ref 12.0–15.0)
MCH: 31.9 pg (ref 26.0–34.0)
MCHC: 34.1 g/dL (ref 30.0–36.0)
MCV: 93.7 fL (ref 80.0–100.0)
Platelets: 369 10*3/uL (ref 150–400)
RBC: 4.76 MIL/uL (ref 3.87–5.11)
RDW: 12.3 % (ref 11.5–15.5)
WBC: 7.7 10*3/uL (ref 4.0–10.5)
nRBC: 0 % (ref 0.0–0.2)

## 2021-10-24 LAB — BASIC METABOLIC PANEL
Anion gap: 10 (ref 5–15)
BUN: 12 mg/dL (ref 6–20)
CO2: 23 mmol/L (ref 22–32)
Calcium: 9.6 mg/dL (ref 8.9–10.3)
Chloride: 107 mmol/L (ref 98–111)
Creatinine, Ser: 0.79 mg/dL (ref 0.44–1.00)
GFR, Estimated: >60 mL/min (ref >60)
Glucose, Bld: 102 mg/dL — ABNORMAL HIGH (ref 70–99)
Potassium: 4.1 mmol/L (ref 3.5–5.1)
Sodium: 140 mmol/L (ref 135–145)

## 2021-10-24 LAB — PREGNANCY, URINE: Preg Test, Ur: NEGATIVE

## 2021-10-24 MED ORDER — ONDANSETRON 4 MG PO TBDP: 4.0000 mg | ORAL_TABLET | Freq: Three times a day (TID) | ORAL | 0 refills | Status: AC | PRN

## 2021-10-24 MED ORDER — ONDANSETRON HCL 4 MG/2ML IJ SOLN
4.0000 mg | Freq: Once | INTRAMUSCULAR | Status: AC
  Administered 2021-10-24: 4 mg via INTRAVENOUS
  Filled 2021-10-24: qty 2

## 2021-10-24 MED ORDER — ACETAMINOPHEN 325 MG PO TABS
650.0000 mg | ORAL_TABLET | Freq: Once | ORAL | Status: AC
  Administered 2021-10-24: 650 mg via ORAL
  Filled 2021-10-24: qty 2

## 2021-10-24 MED ORDER — KETOROLAC TROMETHAMINE 30 MG/ML IJ SOLN
30.0000 mg | Freq: Once | INTRAMUSCULAR | Status: AC
  Administered 2021-10-24: 30 mg via INTRAVENOUS
  Filled 2021-10-24: qty 1

## 2021-10-24 NOTE — ED Provider Notes (Signed)
[facility] Provider Note   CSN: [id number] Arrival date & time: 10/24/21  7829     History  Chief Complaint  Patient presents with   Flank Pain    [name] is a 51 y.o. female present emerged department with left flank pain.  She reports it began about 2 days ago. It has been waxing and waning since then, but more persistent this morning.  She is having some difficulty urinating.  She does have a history of both UTIs and kidney stones and was concerned about 1 or the other.  She denies fevers.  He does report nausea.  Denies constipation or diarrhea.  HPI     Home Medications Prior to Admission medications   Medication Sig Start Date End Date Taking? Authorizing Provider  ondansetron (ZOFRAN-ODT) 4 MG disintegrating tablet Take 1 tablet (4 mg total) by mouth every 8 (eight) hours as needed for up to 15 days for nausea or vomiting. 10/24/21 11/08/21 Yes Jurnee Nakayama, Carola Rhine, MD  acetaminophen (TYLENOL) 325 MG tablet Take 325-650 mg by mouth every 6 (six) hours as needed for headache.    [provider]  amphetamine-dextroamphetamine (ADDERALL XR) 10 MG 24 hr capsule Take 10 mg by mouth daily as needed. 05/15/16   [provider]  amphetamine-dextroamphetamine (ADDERALL XR) 30 MG 24 hr capsule Take 30 mg by mouth daily. 05/15/16   [provider]  ibuprofen (ADVIL,MOTRIN) 800 MG tablet Take 1 tablet (800 mg total) by mouth every 8 (eight) hours as needed. 06/12/16   Servando Salina, MD  omeprazole (PRILOSEC OTC) 20 MG tablet Take 20 mg by mouth daily as needed (for heartburn).    [provider]  PARoxetine (PAXIL) 20 MG tablet Take 20 mg by mouth daily. 05/15/16   [provider]      Allergies    Patient has no known allergies.    Review of Systems   Review of Systems  Physical Exam Updated Vital Signs BP (!) 95/59 (BP Location: Left Arm)   Pulse 73   Temp 98.2 F (36.8 C)   Resp 15   SpO2 99%   Physical Exam Constitutional:      General: She is not in acute distress. HENT:     Head: Normocephalic and atraumatic.  Eyes:     Conjunctiva/sclera: Conjunctivae normal.     Pupils: Pupils are equal, round, and reactive to light.  Cardiovascular:     Rate and Rhythm: Normal rate and regular rhythm.  Pulmonary:     Effort: Pulmonary effort is normal. No respiratory distress.  Abdominal:     General: There is no distension.     Tenderness: There is abdominal tenderness in the left lower quadrant. There is no right CVA tenderness or left CVA tenderness.  Skin:    General: Skin is warm and dry.  Neurological:     General: No focal deficit present.     Mental Status: She is alert. Mental status is at baseline.  Psychiatric:        Mood and Affect: Mood normal.        Behavior: Behavior normal.     ED Results / Procedures / Treatments   Labs (all labs ordered are listed, but only abnormal results are displayed) Labs Reviewed  URINALYSIS, ROUTINE W REFLEX MICROSCOPIC - Abnormal; Notable for the following components:      Result Value   Hgb urine dipstick TRACE (*)    Protein, ur TRACE (*)  Bacteria, UA RARE (*)    All other components within normal limits  CBC - Abnormal; Notable for the following components:   Hemoglobin 15.2 (*)    All other components within normal limits  BASIC METABOLIC PANEL - Abnormal; Notable for the following components:   Glucose, Bld 102 (*)    All other components within normal limits  PREGNANCY, URINE    EKG None  Radiology CT Renal Stone Study  Result Date: 10/24/2021 CLINICAL DATA:  Left flank pain with nausea and vomiting for 2 days. EXAM: CT ABDOMEN AND PELVIS WITHOUT CONTRAST TECHNIQUE: Multidetector CT imaging of the abdomen and pelvis was performed following the standard protocol without IV contrast. RADIATION DOSE REDUCTION: This exam was performed according to the departmental dose-optimization program which includes automated  exposure control, adjustment of the mA and/or kV according to patient size and/or use of iterative reconstruction technique. COMPARISON:  Abdominopelvic CT 02/29/2016 and 10/01/2016. FINDINGS: Lower chest: Mild right middle lobe scarring. The lung bases are otherwise clear. There is no pleural or pericardial effusion. Hepatobiliary: The liver appears unremarkable as imaged in the noncontrast state. No evidence of gallstones, gallbladder wall thickening or biliary dilatation. Pancreas: Unremarkable. No pancreatic ductal dilatation or surrounding inflammatory changes. Spleen: Normal in size without focal abnormality. Adrenals/Urinary Tract: Both adrenal glands appear normal. Several nonobstructing left renal calculi are again noted. There is no evidence of hydronephrosis or ureteral calculus. 1.2 cm exophytic lesion in the interpolar region of the left kidney is suboptimally characterized without contrast, although likely represents a cyst and does not require any specific imaging follow-up unless clinically warranted. The kidneys otherwise appear unremarkable. The bladder appears unremarkable for its degree of distention. Stomach/Bowel: No enteric contrast administered. The stomach appears unremarkable for its degree of distension. No evidence of bowel wall thickening, distention or surrounding inflammatory change. The appendix appears normal. Mild diverticular changes of the distal colon. Vascular/Lymphatic: There are no enlarged abdominal or pelvic lymph nodes. Mild aortoiliac atherosclerosis without evidence of aneurysm. Reproductive: Suspected right fundal uterine fibroid. No evidence of adnexal mass. Other: No evidence of abdominal wall mass or hernia. No ascites. Musculoskeletal: No acute or significant osseous findings. Mild lumbar facet arthropathy. IMPRESSION: 1. Nonobstructing left renal calculi. No evidence of ureteral calculus or hydronephrosis. 2. No acute abdominopelvic findings. 3. Suspected uterine  fibroid. 4. Mild Aortic Atherosclerosis (ICD10-I70.0). Electronically Signed   By: Richardean Sale M.D.   On: 10/24/2021 08:48    Procedures Procedures    Medications Ordered in ED Medications  ketorolac (TORADOL) 30 MG/ML injection 30 mg (30 mg Intravenous Given 10/24/21 0926)  acetaminophen (TYLENOL) tablet 650 mg (650 mg Oral Given 10/24/21 0926)  ondansetron (ZOFRAN) injection 4 mg (4 mg Intravenous Given 10/24/21 3016)    ED Course/ Medical Decision Making/ A&P Clinical Course as of 10/24/21 1128  Fri Oct 24, 2021  1126 Patient is feeling much better after medications.  She will follow-up with her PCP as well as her OB/GYN about a possible uterine fibroid.  Okay for discharge [MT]  1128 BP somewhat lower as patient was sleeping, but she is mentating well, and I doubt this is shock.  MAP is well above 65. [MT]    Clinical Course User Index [MT] Socorro Kanitz, Carola Rhine, MD                           Medical Decision Making Amount and/or Complexity of Data Reviewed Labs: ordered. Radiology: ordered.  Risk  OTC drugs. Prescription drug management.   This patient presents to the ED with concern for flank pain, abdominal pain, difficulty urinating. This involves an extensive number of treatment options, and is a complaint that carries with it a high risk of complications and morbidity.  The differential diagnosis includes UTI versus dysuria versus ureteral colic versus renal stone versus other  I ordered and personally interpreted labs.  The pertinent results include: No significant findings.  No clear evidence of UTI or infection.  I ordered imaging studies including CT renal study I independently visualized and interpreted imaging which showed no passing kidney stone, incidental uterine fibroid noted, no clear finding to correlate with the patient's symptoms I agree with the radiologist interpretation  I ordered medication including IV Toradol, IV Zofran, p.o. Tylenol for pain and  nausea  Test Considered: This seems less likely to be an ovarian torsion at her age, perimenopausal.  Do not believe we need emergent pelvic imaging  After the interventions noted above, I reevaluated the patient and found that they have: improved  Dispostion:  After consideration of the diagnostic results and the patients response to treatment, I feel that the patent would benefit from outpatient f/u.         Final Clinical Impression(s) / ED Diagnoses Final diagnoses:  Uterine leiomyoma, unspecified location  Left lower quadrant abdominal pain  Nausea    Rx / DC Orders ED Discharge Orders          Ordered    ondansetron (ZOFRAN-ODT) 4 MG disintegrating tablet  Every 8 hours PRN        10/24/21 1124              Wyvonnia Dusky, MD 10/24/21 1128

## 2021-10-24 NOTE — ED Triage Notes (Signed)
Pt presents POV from home with Left flank pain x2 days, N/V starting at 0200. Pt denies burning with urination, reports increase in urgency to go but unable to urinate at times.Hx of kidney stones 20 years ago

## 2022-05-07 ENCOUNTER — Other Ambulatory Visit: Payer: Self-pay | Admitting: Physician Assistant

## 2022-05-07 DIAGNOSIS — Z1231 Encounter for screening mammogram for malignant neoplasm of breast: Secondary | ICD-10-CM
